# Patient Record
Sex: Male | Born: 1955
Health system: Southern US, Community
[De-identification: ages and names within clinical notes are randomized; demographics above are authoritative.]

## PROBLEM LIST (undated history)

## (undated) DIAGNOSIS — I10 Essential (primary) hypertension: Secondary | ICD-10-CM

## (undated) DIAGNOSIS — J45909 Unspecified asthma, uncomplicated: Secondary | ICD-10-CM

## (undated) DIAGNOSIS — Z87442 Personal history of urinary calculi: Secondary | ICD-10-CM

## (undated) DIAGNOSIS — N200 Calculus of kidney: Secondary | ICD-10-CM

## (undated) DIAGNOSIS — N400 Enlarged prostate without lower urinary tract symptoms: Secondary | ICD-10-CM

## (undated) HISTORY — DX: Unspecified asthma, uncomplicated: J45.909

## (undated) HISTORY — PX: COLONOSCOPY: SHX174

## (undated) HISTORY — DX: Essential (primary) hypertension: I10

## (undated) HISTORY — PX: CYSTOSCOPY WITH HOLMIUM LASER LITHOTRIPSY: SHX6639

---
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
## 2007-12-01 ENCOUNTER — Ambulatory Visit (HOSPITAL_COMMUNITY): Admission: RE | Admit: 2007-12-01 | Discharge: 2007-12-01 | Payer: Self-pay | Admitting: Urology

## 2007-12-08 ENCOUNTER — Ambulatory Visit (HOSPITAL_COMMUNITY): Admission: RE | Admit: 2007-12-08 | Discharge: 2007-12-08 | Payer: Self-pay | Admitting: Urology

## 2018-02-05 DIAGNOSIS — Z23 Encounter for immunization: Secondary | ICD-10-CM | POA: Diagnosis not present

## 2018-06-04 DIAGNOSIS — Z0000 Encounter for general adult medical examination without abnormal findings: Secondary | ICD-10-CM | POA: Diagnosis not present

## 2018-06-04 DIAGNOSIS — R5383 Other fatigue: Secondary | ICD-10-CM | POA: Diagnosis not present

## 2018-06-04 DIAGNOSIS — E7800 Pure hypercholesterolemia, unspecified: Secondary | ICD-10-CM | POA: Diagnosis not present

## 2018-06-04 DIAGNOSIS — J45909 Unspecified asthma, uncomplicated: Secondary | ICD-10-CM | POA: Diagnosis not present

## 2018-06-04 DIAGNOSIS — N401 Enlarged prostate with lower urinary tract symptoms: Secondary | ICD-10-CM | POA: Diagnosis not present

## 2018-06-04 DIAGNOSIS — Z1211 Encounter for screening for malignant neoplasm of colon: Secondary | ICD-10-CM | POA: Diagnosis not present

## 2018-08-20 DIAGNOSIS — J45909 Unspecified asthma, uncomplicated: Secondary | ICD-10-CM | POA: Diagnosis not present

## 2018-08-20 DIAGNOSIS — I10 Essential (primary) hypertension: Secondary | ICD-10-CM | POA: Diagnosis not present

## 2018-08-20 DIAGNOSIS — Z6827 Body mass index (BMI) 27.0-27.9, adult: Secondary | ICD-10-CM | POA: Diagnosis not present

## 2018-08-20 DIAGNOSIS — E7800 Pure hypercholesterolemia, unspecified: Secondary | ICD-10-CM | POA: Diagnosis not present

## 2018-08-20 DIAGNOSIS — Z299 Encounter for prophylactic measures, unspecified: Secondary | ICD-10-CM | POA: Diagnosis not present

## 2018-12-03 ENCOUNTER — Other Ambulatory Visit: Payer: Self-pay

## 2018-12-03 DIAGNOSIS — Z20822 Contact with and (suspected) exposure to covid-19: Secondary | ICD-10-CM

## 2018-12-05 LAB — NOVEL CORONAVIRUS, NAA: SARS-CoV-2, NAA: NOT DETECTED

## 2019-05-06 DIAGNOSIS — Z8616 Personal history of COVID-19: Secondary | ICD-10-CM

## 2019-05-06 HISTORY — DX: Personal history of COVID-19: Z86.16

## 2019-05-30 ENCOUNTER — Ambulatory Visit: Payer: 59 | Attending: Internal Medicine

## 2019-05-30 ENCOUNTER — Other Ambulatory Visit: Payer: Self-pay

## 2019-05-30 DIAGNOSIS — Z20822 Contact with and (suspected) exposure to covid-19: Secondary | ICD-10-CM

## 2019-05-31 LAB — NOVEL CORONAVIRUS, NAA: SARS-CoV-2, NAA: DETECTED — AB

## 2019-09-30 ENCOUNTER — Other Ambulatory Visit: Payer: Self-pay

## 2019-09-30 ENCOUNTER — Other Ambulatory Visit (HOSPITAL_COMMUNITY)
Admission: RE | Admit: 2019-09-30 | Discharge: 2019-09-30 | Disposition: A | Discharge status: Home / Self Care | Payer: 59 | Source: Ambulatory Visit | Attending: Urology | Admitting: Urology

## 2019-09-30 ENCOUNTER — Other Ambulatory Visit: Payer: Self-pay | Admitting: Urology

## 2019-09-30 ENCOUNTER — Telehealth: Payer: Self-pay

## 2019-09-30 ENCOUNTER — Ambulatory Visit: Payer: 59 | Admitting: Urology

## 2019-09-30 ENCOUNTER — Encounter: Payer: Self-pay | Admitting: Urology

## 2019-09-30 VITALS — BP 158/89 | HR 80 | Temp 97.3°F | Ht 69.0 in | Wt 185.0 lb

## 2019-09-30 DIAGNOSIS — R972 Elevated prostate specific antigen [PSA]: Secondary | ICD-10-CM

## 2019-09-30 DIAGNOSIS — R3912 Poor urinary stream: Secondary | ICD-10-CM | POA: Diagnosis not present

## 2019-09-30 DIAGNOSIS — N138 Other obstructive and reflux uropathy: Secondary | ICD-10-CM

## 2019-09-30 DIAGNOSIS — R3129 Other microscopic hematuria: Secondary | ICD-10-CM | POA: Diagnosis not present

## 2019-09-30 DIAGNOSIS — N403 Nodular prostate with lower urinary tract symptoms: Secondary | ICD-10-CM | POA: Diagnosis not present

## 2019-09-30 DIAGNOSIS — N401 Enlarged prostate with lower urinary tract symptoms: Secondary | ICD-10-CM | POA: Diagnosis not present

## 2019-09-30 LAB — POCT URINALYSIS DIPSTICK
Glucose, UA: NEGATIVE
Ketones, UA: NEGATIVE
Nitrite, UA: NEGATIVE
Protein, UA: POSITIVE — AB
Spec Grav, UA: 1.02 (ref 1.010–1.025)
Urobilinogen, UA: NEGATIVE E.U./dL — AB
pH, UA: 7 (ref 5.0–8.0)

## 2019-09-30 LAB — URINALYSIS, ROUTINE W REFLEX MICROSCOPIC
Bilirubin Urine: NEGATIVE
Glucose, UA: NEGATIVE mg/dL
Ketones, ur: NEGATIVE mg/dL
Nitrite: NEGATIVE
Protein, ur: 30 mg/dL — AB
RBC / HPF: >50 RBC/hpf — ABNORMAL HIGH (ref 0–5)
Specific Gravity, Urine: 1.019 (ref 1.005–1.030)
pH: 6 (ref 5.0–8.0)

## 2019-09-30 NOTE — Progress Notes (Signed)
Subjective: 1. Elevated PSA   2. BPH with urinary obstruction   3. Nodular prostate with lower urinary tract symptoms   4. Weak urinary stream   5. Microhematuria      Jerry Walker is a 64 yo AAM who is sent in consultation by Dr. Sherryll Burger for an elevated PSA that was 5.2 in 1/20.  He had a workplace physical in February and it was up to 14.  He saw Dr. Sherryll Burger and had another PSA ordered in March but didn't get that done.     He had a positive Covid test in 05/25/19 and had infusion therapy and was pretty sick with it but has recovered.  He has moderate LUTS with an IPSS of 14.  He had seen Dr. Nechama Guard in the past and was placed on tamsulosin several years ago but Dr. Sherryll Burger increased it to 2 daily.  He is voiding better with the higher dose.  He has not had a prior prostate biopsy.  He has had no hematuria or dysuria.   His UA is heme and LE positive today.    ROS:  ROS  Not on File  Past Medical History:  Diagnosis Date  . Asthma   . Hypertension   . Kidney stones     History reviewed. No pertinent surgical history.  Social History   Socioeconomic History  . Marital status: Single    Spouse name: Not on file  . Number of children: Not on file  . Years of education: Not on file  . Highest education level: Not on file  Occupational History  . Occupation: Chiropractor  Tobacco Use  . Smoking status: Never Smoker  . Smokeless tobacco: Never Used  Substance and Sexual Activity  . Alcohol use: Not on file  . Drug use: Not on file  . Sexual activity: Not on file  Other Topics Concern  . Not on file  Social History Narrative  . Not on file   Social Determinants of Health   Financial Resource Strain:   . Difficulty of Paying Living Expenses:   Food Insecurity:   . Worried About Programme researcher, broadcasting/film/video in the Last Year:   . Barista in the Last Year:   Transportation Needs:   . Freight forwarder (Medical):   Marland Kitchen Lack of Transportation (Non-Medical):   Physical  Activity:   . Days of Exercise per Week:   . Minutes of Exercise per Session:   Stress:   . Feeling of Stress :   Social Connections:   . Frequency of Communication with Friends and Family:   . Frequency of Social Gatherings with Friends and Family:   . Attends Religious Services:   . Active Member of Clubs or Organizations:   . Attends Banker Meetings:   Marland Kitchen Marital Status:   Intimate Partner Violence:   . Fear of Current or Ex-Partner:   . Emotionally Abused:   Marland Kitchen Physically Abused:   . Sexually Abused:     History reviewed. No pertinent family history.  Anti-infectives: Anti-infectives (From admission, onward)   None      Current Outpatient Medications  Medication Sig Dispense Refill  . FLOVENT HFA 110 MCG/ACT inhaler Inhale 2 puffs into the lungs 2 (two) times daily.    . hydrochlorothiazide (HYDRODIURIL) 25 MG tablet Take 25 mg by mouth daily.    . tamsulosin (FLOMAX) 0.4 MG CAPS capsule Take 0.8 mg by mouth daily.     No current  facility-administered medications for this visit.     Objective: Vital signs in last 24 hours: BP (!) 158/89   Pulse 80   Temp (!) 97.3 F (36.3 C)   Ht 5\' 9"  (1.753 m)   Wt 185 lb (83.9 kg)   BMI 27.32 kg/m   Intake/Output from previous day: No intake/output data recorded. Intake/Output this shift: @IOTHISSHIFT @   Physical Exam Vitals reviewed.  Constitutional:      Appearance: Normal appearance.  Genitourinary:    Comments: Nl Uncirc phallus with adequate meatus. Scrotum normal. Testes normal. Epididymis normal except right head spermatocele.  AP small hemorrhoid. NST without mass. Prostate 2.5+ benign. SV's non-palpable.  Lymphadenopathy:     Cervical: No cervical adenopathy.     Upper Body:     Right upper body: No supraclavicular adenopathy.     Left upper body: No supraclavicular adenopathy.  Neurological:     Mental Status: He is alert.     Lab Results:  Results for orders placed or  performed in visit on 09/30/19 (from the past 24 hour(s))  POCT urinalysis dipstick     Status: Abnormal   Collection Time: 09/30/19  9:42 AM  Result Value Ref Range   Color, UA dk yellow    Clarity, UA clear    Glucose, UA Negative Negative   Bilirubin, UA small    Ketones, UA neg    Spec Grav, UA 1.020 1.010 - 1.025   Blood, UA large    pH, UA 7.0 5.0 - 8.0   Protein, UA Positive (A) Negative   Urobilinogen, UA negative (A) 0.2 or 1.0 E.U./dL   Nitrite, UA neg    Leukocytes, UA Moderate (2+) (A) Negative   Appearance clear    Odor      BMET No results for input(s): NA, K, CL, CO2, GLUCOSE, BUN, CREATININE, CALCIUM in the last 72 hours. PT/INR No results for input(s): LABPROT, INR in the last 72 hours. ABG No results for input(s): PHART, HCO3 in the last 72 hours.  Invalid input(s): PCO2, PO2  Studies/Results: No results found.   Assessment/Plan: Elevated PSA.  His PSA was 5.2 in 1/20 but jumped to 14 in 2/21 but he had COVID in 1/21 which can increase the PSA.   I will repeat the PSA and if it remains elevated get him set up for a biopsy.  I reviewed the risks of bleeding,infection and voiding difficulty.  If the PSA comes down to normal, I will have him return in 6 months with a PSA.   I will also get records from Dr. Exie Parody, his prior urologist.  Microhematuria and pyuria on UA.  I will get a UA micro and culture.  BPH with BOO.  He will stay on tamsulosin.    No orders of the defined types were placed in this encounter.    Orders Placed This Encounter  Procedures  . Urine Culture    Standing Status:   Future    Standing Expiration Date:   09/29/2020  . Urinalysis, Routine w reflex microscopic    Standing Status:   Future    Standing Expiration Date:   09/29/2020  . PSA, total and free    Standing Status:   Future    Standing Expiration Date:   09/29/2020  . PSA, total and free    Standing Status:   Future    Standing Expiration Date:   09/29/2020  . POCT  urinalysis dipstick     Return in about 6  months (around 04/01/2020) for with PSA. Marland Kitchen    CC: Dr. Kirstie Peri.     Jerry Walker 09/30/2019 [phone]

## 2019-09-30 NOTE — Telephone Encounter (Signed)
-----   Message from Bjorn Pippin, MD sent at 09/29/2019  7:19 PM EDT ----- This fellow is referred from [facility].  I see PSA elevation on his list of Dx in Shah's note but I don't see the results.  I will need that.

## 2019-09-30 NOTE — Progress Notes (Signed)
Urological Symptom Review  Patient is experiencing the following symptoms: Frequent urination Hard to postpone urination Get up at night to urinate Trouble starting stream   Review of Systems  Gastrointestinal (upper)  : Negative for upper GI symptoms  Gastrointestinal (lower) : Negative for lower GI symptoms  Constitutional : Negative for symptoms  Skin: Negative for skin symptoms  Eyes: Negative for eye symptoms  Ear/Nose/Throat : Negative for Ear/Nose/Throat symptoms  Hematologic/Lymphatic: Negative for Hematologic/Lymphatic symptoms  Cardiovascular : Negative for cardiovascular symptoms  Respiratory : Negative for respiratory symptoms  Endocrine: Negative for endocrine symptoms  Musculoskeletal: Negative for musculoskeletal symptoms  Neurological: Negative for neurological symptoms  Psychologic: Negative for psychiatric symptoms 

## 2019-09-30 NOTE — Telephone Encounter (Signed)
Called Dr. Sherryll Burger office. Will send recent psa from 2020

## 2019-10-01 LAB — URINE CULTURE: Culture: NO GROWTH

## 2019-10-04 ENCOUNTER — Telehealth: Payer: Self-pay

## 2019-10-04 ENCOUNTER — Other Ambulatory Visit: Payer: Self-pay

## 2019-10-04 DIAGNOSIS — R3129 Other microscopic hematuria: Secondary | ICD-10-CM

## 2019-10-04 LAB — PSA, TOTAL AND FREE
PSA, % Free: 11 % (calc) — ABNORMAL LOW (ref >25)
PSA, Free: 1 ng/mL
PSA, Total: 8.9 ng/mL — ABNORMAL HIGH (ref <=4.0)

## 2019-10-04 NOTE — Telephone Encounter (Signed)
Ct ordered. Pt called and made aware.

## 2019-10-04 NOTE — Telephone Encounter (Signed)
-----   Message from Bjorn Pippin, MD sent at 10/04/2019 11:48 AM EDT ----- His culture is negative but he has significant micro hematuria with >50 RBC's.    He needs a CT hematuria study and will need a f/u appointment for cystoscopy.   ----- Message ----- From: Gustavus Messing, LPN Sent: 07/08/5215  10:23 AM EDT To: Bjorn Pippin, MD  Please review

## 2019-10-05 NOTE — Progress Notes (Signed)
His PSA is back up to 8.9 with a low f/t ratio of 11%.  He needs to be scheduled for a prostate [place] and biopsy and will need levaquin 750mg  sent for the prep.  We discussed the biopsy at his visit.

## 2019-10-06 ENCOUNTER — Other Ambulatory Visit: Payer: Self-pay

## 2019-10-06 ENCOUNTER — Other Ambulatory Visit: Payer: Self-pay | Admitting: Urology

## 2019-10-06 ENCOUNTER — Telehealth: Payer: Self-pay

## 2019-10-06 ENCOUNTER — Other Ambulatory Visit (HOSPITAL_COMMUNITY): Payer: Self-pay | Admitting: Urology

## 2019-10-06 DIAGNOSIS — R972 Elevated prostate specific antigen [PSA]: Secondary | ICD-10-CM

## 2019-10-06 MED ORDER — LEVOFLOXACIN 750 MG PO TABS: 750.0000 mg | ORAL_TABLET | ORAL | 0 refills | Status: AC

## 2019-10-07 NOTE — Telephone Encounter (Signed)
Pt. returned call to office this a.m. and given biopsy date, time, and instructions. Pt. voiced understanding.

## 2019-10-18 ENCOUNTER — Other Ambulatory Visit: Payer: Self-pay

## 2019-10-18 ENCOUNTER — Ambulatory Visit (HOSPITAL_COMMUNITY)
Admission: RE | Admit: 2019-10-18 | Discharge: 2019-10-18 | Disposition: A | Discharge status: Home / Self Care | Payer: 59 | Source: Ambulatory Visit | Attending: Urology | Admitting: Urology

## 2019-10-18 DIAGNOSIS — R3129 Other microscopic hematuria: Secondary | ICD-10-CM | POA: Diagnosis present

## 2019-10-18 LAB — POCT I-STAT CREATININE: Creatinine, Ser: 1.2 mg/dL (ref 0.61–1.24)

## 2019-10-18 MED ORDER — IOHEXOL 300 MG/ML  SOLN
150.0000 mL | Freq: Once | INTRAMUSCULAR | Status: AC | PRN
  Administered 2019-10-18: 125 mL via INTRAVENOUS

## 2019-10-20 NOTE — Progress Notes (Signed)
Pt decided he wanted sedation for the biopsy.   We will cancel today and reschedule in the OR.

## 2019-10-21 ENCOUNTER — Ambulatory Visit (INDEPENDENT_AMBULATORY_CARE_PROVIDER_SITE_OTHER): Payer: 59 | Admitting: Urology

## 2019-10-21 ENCOUNTER — Encounter (HOSPITAL_COMMUNITY): Payer: Self-pay

## 2019-10-21 ENCOUNTER — Other Ambulatory Visit: Payer: Self-pay

## 2019-10-21 ENCOUNTER — Ambulatory Visit (HOSPITAL_COMMUNITY)
Admission: RE | Admit: 2019-10-21 | Discharge: 2019-10-21 | Disposition: A | Discharge status: Home / Self Care | Payer: 59 | Source: Ambulatory Visit | Attending: Urology | Admitting: Urology

## 2019-10-21 DIAGNOSIS — R972 Elevated prostate specific antigen [PSA]: Secondary | ICD-10-CM

## 2019-10-21 MED ORDER — CEFTRIAXONE SODIUM 1 G IJ SOLR
INTRAMUSCULAR | Status: AC
  Filled 2019-10-21: qty 10

## 2019-10-21 MED ORDER — LIDOCAINE HCL (PF) 2 % IJ SOLN
INTRAMUSCULAR | Status: AC
  Filled 2019-10-21: qty 10

## 2019-10-21 MED ORDER — LIDOCAINE HCL (PF) 1 % IJ SOLN
INTRAMUSCULAR | Status: AC
  Filled 2019-10-21: qty 5

## 2019-10-27 NOTE — Progress Notes (Signed)
Subjective: 1. Jerry Walker, Jerry Walker   2. Jerry Walker   3. Jerry Walker     10/28/19: Jerry Walker returns today in f/u.  He declined the recent biopsy because he wanted sedation.  His Walker had gone up to 8.9 with 11% f/t ratio.  He had a CT for hematuria and was found to have a 55mm left Jerry pelvic stone.  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      Jerry Walker is a 64 yo AAM who is sent in consultation by Jerry Walker for an Jerry Walker that was 5.2 in 1/20.  He had a workplace physical in February and it was up to 14.  He saw Jerry Walker and had another Walker ordered in March but didn't get that done.     He had a positive Covid test in 05/25/19 and had infusion therapy and was pretty sick with it but has recovered.  He has moderate LUTS with an IPSS of 14.  He had seen Jerry Walker in the past and was placed on tamsulosin several years ago but Jerry Walker increased it to 2 daily.  He is voiding better with the higher dose.  He has not had a prior prostate biopsy.  He has had no hematuria or dysuria.   His UA is heme and LE positive today.       ROS:  Review of Systems  All other systems reviewed and are negative.   No Known Allergies  Past Medical History:  Diagnosis Date  . Asthma   . Hypertension   . Kidney Walker     History reviewed. No pertinent surgical history.  Social History   Socioeconomic History  . Marital status: Single    Spouse name: Not on file  . Number of children: Not on file  . Years of education: Not on file  . Highest education level: Not on file  Occupational History  . Occupation: Human resources officer  Tobacco Use  . Smoking status: Never Smoker  . Smokeless tobacco: Never Used  Substance and Sexual Activity  . Alcohol use: Not on file  . Drug use: Not on file  . Sexual activity: Not on file  Other  Topics Concern  . Not on file  Social History Narrative  . Not on file   Social Determinants of Health   Financial Resource Strain:   . Difficulty of Paying Living Expenses:   Food Insecurity:   . Worried About Charity fundraiser in the Last Year:   . Arboriculturist in the Last Year:   Transportation Needs:   . Film/video editor (Medical):   Marland Kitchen Lack of Transportation (Non-Medical):   Physical Activity:   . Days of Exercise per Week:   . Minutes of Exercise per Session:   Stress:   . Feeling of Stress :   Social Connections:   . Frequency of Communication with Friends and Family:   . Frequency of Social Gatherings with Friends and Family:   . Attends Religious Services:   . Active Member of Clubs or Organizations:   . Attends Archivist Meetings:   Marland Kitchen Marital Status:   Intimate Partner Violence:   . Fear of Current or Ex-Partner:   . Emotionally Abused:   Marland Kitchen Physically Abused:   . Sexually Abused:  History reviewed. No pertinent family history.  Anti-infectives: Anti-infectives (From admission, onward)   Start     Dose/Rate Route Frequency Ordered Stop   10/28/19 0915  CIPROFLOXACIN HCL 500 MG PO TABS  Status:  Discontinued        500 mg Oral  Once 10/28/19 3009 10/28/19 0936      Current Outpatient Medications  Medication Sig Dispense Refill  . FLOVENT HFA 110 MCG/ACT inhaler Inhale 2 puffs into the lungs 2 (two) times daily.    . hydrochlorothiazide (HYDRODIURIL) 25 MG tablet Take 25 mg by mouth daily.    . tamsulosin (FLOMAX) 0.4 MG CAPS capsule Take 0.8 mg by mouth daily.     No current facility-administered medications for this visit.     Objective: Vital signs in last 24 hours: BP 131/79   Pulse 66   Temp (!) 97.3 F (36.3 C)   Ht 5\' 9"  (1.753 m)   Wt 185 lb (83.9 kg)   BMI 27.32 kg/m   Intake/Output from previous day: No intake/output data recorded. Intake/Output this shift: @IOTHISSHIFT @   Physical Exam Vitals reviewed.   Constitutional:      Appearance: Normal appearance.  Cardiovascular:     Rate and Rhythm: Normal rate and regular rhythm.  Pulmonary:     Effort: Pulmonary effort is normal.     Breath sounds: Normal breath sounds.  Neurological:     Mental Status: He is alert.     Lab Results:  Results for orders placed or performed in visit on 10/28/19 (from the past 24 hour(s))  POCT urinalysis dipstick     Status: Abnormal   Collection Time: 10/28/19  9:13 AM  Result Value Ref Range   Color, UA yellow    Clarity, UA     Glucose, UA Negative Negative   Bilirubin, UA neg    Ketones, UA neg    Spec Grav, UA 1.020 1.010 - 1.025   Blood, UA +++    pH, UA 5.0 5.0 - 8.0   Protein, UA Positive (A) Negative   Urobilinogen, UA 0.2 0.2 or 1.0 E.U./dL   Nitrite, UA neg    Leukocytes, UA Small (1+) (A) Negative   Appearance clear    Odor      Walker 8.9 with 11% f/t ratio.   Studies/Results: CT IMPRESSION: 1. Moderately large calculus in the LEFT Jerry pelvis just proximal to the UPJ and associated with mild hydronephrosis and peripelvic stranding. Enhancement remain symmetric and contrast is Walker beyond the calculus. 2. Numerous calculi in the RIGHT kidney greater than 10, largest in the lower pole measuring approximately 1 cm with at least 4 calculi greater than 5 mm. 3. Numerous venous collaterals in the pelvis exaggerate the appearance of the rectal and and the urinary bladder wall. Significance and etiology is uncertain as there is no clear explanation for the findings seen on the current exam. Note that the prostate and bladder base are heterogeneous as described. Cystoscopic assessment of the urinary bladder may be helpful in addition to assessment of the prostate gland which has a nonspecific appearance on CT but is indistinct on today's study. 4. Enhancing lesion arising from the lateral segment of the LEFT hepatic lobe likely a hemangioma. Consider follow-up MRI for  further assessment. 5. Rectal thickening favored to represent internal hemorrhoids given the numerous venous collaterals in the pelvis. Correlation with recent colonoscopy results and or patient history may be helpful. 6. Mild congestive changes are suggested in the abdominal mesentery of  the small bowel without focal lesion. No nodularity in the peritoneal cavity elsewhere or other suspicious finding such as ascites. Correlate with any abdominal symptoms, similar changes though less pronounced were Walker in August 30, 2007.   Assessment/Plan: Jerry Walker.  His Walker on repeat is up to 8.9 with 11% f/t ration and the prostate is abnormal in appearance on CT.  He declined a non-sedated biopsy and will be rescheduled for the OR.   I reviewed the risks of bleeding, infection and voiding difficulty.  Microhematuria.  He has a 1cm left Jerry pelvic stone with mild obstruction but no pain.  He has parenchymal calcifications on the right with some Jerry atrophy and had prior ESWL on that side.   Since he needs to go to the OR for the prostate [place] and biopsy, I will get him set up for ureteroscopy with laser and stent and reviewed the risks of bleeding, infection, ureteral injury, need for a stent and secondary procedures, thrombotic events and anesthetic complications.    BPH with BOO.  He will stay on tamsulosin.    Meds ordered this encounter  Medications  . DISCONTD: ciprofloxacin (CIPRO) tablet 500 mg     Orders Placed This Encounter  Procedures  . POCT urinalysis dipstick     Return for Next available for prostate biopsy and left ureteroscopy in the OR.  .    CC: Jerry Walker.     Jerry Walker 10/28/2019 [phone]

## 2019-10-27 NOTE — H&P (View-Only) (Signed)
Subjective: 1. Jerry Walker, unspecified whether lower urinary tract symptoms present   2. Elevated PSA   3. Renal stones     10/28/19: Jerry Walker returns today in f/u.  He declined the recent biopsy because he wanted sedation.  His PSA had gone up to 8.9 with 11% f/t ratio.  He had a CT for hematuria and was found to have a 55mm left renal pelvic stone.  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      Jerry Walker is a 64 yo AAM who is sent in consultation by Dr. Manuella Ghazi for an elevated PSA that was 5.2 in 1/20.  He had a workplace physical in February and it was up to 14.  He saw Dr. Manuella Ghazi and had another PSA ordered in March but didn't get that done.     He had a positive Covid test in 05/25/19 and had infusion therapy and was pretty sick with it but has recovered.  He has moderate LUTS with an IPSS of 14.  He had seen Dr. Exie Parody in the past and was placed on tamsulosin several years ago but Dr. Manuella Ghazi increased it to 2 daily.  He is voiding better with the higher dose.  He has not had a prior prostate biopsy.  He has had no hematuria or dysuria.   His UA is heme and LE positive today.       ROS:  Review of Systems  All other systems reviewed and are negative.   No Known Allergies  Past Medical History:  Diagnosis Date  . Asthma   . Hypertension   . Kidney stones     History reviewed. No pertinent surgical history.  Social History   Socioeconomic History  . Marital status: Single    Spouse name: Not on file  . Number of children: Not on file  . Years of education: Not on file  . Highest education level: Not on file  Occupational History  . Occupation: Human resources officer  Tobacco Use  . Smoking status: Never Smoker  . Smokeless tobacco: Never Used  Substance and Sexual Activity  . Alcohol use: Not on file  . Drug use: Not on file  . Sexual activity: Not on file  Other  Topics Concern  . Not on file  Social History Narrative  . Not on file   Social Determinants of Health   Financial Resource Strain:   . Difficulty of Paying Living Expenses:   Food Insecurity:   . Worried About Charity fundraiser in the Last Year:   . Arboriculturist in the Last Year:   Transportation Needs:   . Film/video editor (Medical):   Marland Kitchen Lack of Transportation (Non-Medical):   Physical Activity:   . Days of Exercise per Week:   . Minutes of Exercise per Session:   Stress:   . Feeling of Stress :   Social Connections:   . Frequency of Communication with Friends and Family:   . Frequency of Social Gatherings with Friends and Family:   . Attends Religious Services:   . Active Member of Clubs or Organizations:   . Attends Archivist Meetings:   Marland Kitchen Marital Status:   Intimate Partner Violence:   . Fear of Current or Ex-Partner:   . Emotionally Abused:   Marland Kitchen Physically Abused:   . Sexually Abused:  History reviewed. No pertinent family history.  Anti-infectives: Anti-infectives (From admission, onward)   Start     Dose/Rate Route Frequency Ordered Stop   10/28/19 0915  CIPROFLOXACIN HCL 500 MG PO TABS  Status:  Discontinued        500 mg Oral  Once 10/28/19 3009 10/28/19 0936      Current Outpatient Medications  Medication Sig Dispense Refill  . FLOVENT HFA 110 MCG/ACT inhaler Inhale 2 puffs into the lungs 2 (two) times daily.    . hydrochlorothiazide (HYDRODIURIL) 25 MG tablet Take 25 mg by mouth daily.    . tamsulosin (FLOMAX) 0.4 MG CAPS capsule Take 0.8 mg by mouth daily.     No current facility-administered medications for this visit.     Objective: Vital signs in last 24 hours: BP 131/79   Pulse 66   Temp (!) 97.3 F (36.3 C)   Ht 5\' 9"  (1.753 m)   Wt 185 lb (83.9 kg)   BMI 27.32 kg/m   Intake/Output from previous day: No intake/output data recorded. Intake/Output this shift: @IOTHISSHIFT @   Physical Exam Vitals reviewed.   Constitutional:      Appearance: Normal appearance.  Cardiovascular:     Rate and Rhythm: Normal rate and regular rhythm.  Pulmonary:     Effort: Pulmonary effort is normal.     Breath sounds: Normal breath sounds.  Neurological:     Mental Status: He is alert.     Lab Results:  Results for orders placed or performed in visit on 10/28/19 (from the past 24 hour(s))  POCT urinalysis dipstick     Status: Abnormal   Collection Time: 10/28/19  9:13 AM  Result Value Ref Range   Color, UA yellow    Clarity, UA     Glucose, UA Negative Negative   Bilirubin, UA neg    Ketones, UA neg    Spec Grav, UA 1.020 1.010 - 1.025   Blood, UA +++    pH, UA 5.0 5.0 - 8.0   Protein, UA Positive (A) Negative   Urobilinogen, UA 0.2 0.2 or 1.0 E.U./dL   Nitrite, UA neg    Leukocytes, UA Small (1+) (A) Negative   Appearance clear    Odor      PSA 8.9 with 11% f/t ratio.   Studies/Results: CT IMPRESSION: 1. Moderately large calculus in the LEFT renal pelvis just proximal to the UPJ and associated with mild hydronephrosis and peripelvic stranding. Enhancement remain symmetric and contrast is present beyond the calculus. 2. Numerous calculi in the RIGHT kidney greater than 10, largest in the lower pole measuring approximately 1 cm with at least 4 calculi greater than 5 mm. 3. Numerous venous collaterals in the pelvis exaggerate the appearance of the rectal and and the urinary bladder wall. Significance and etiology is uncertain as there is no clear explanation for the findings seen on the current exam. Note that the prostate and bladder base are heterogeneous as described. Cystoscopic assessment of the urinary bladder may be helpful in addition to assessment of the prostate gland which has a nonspecific appearance on CT but is indistinct on today's study. 4. Enhancing lesion arising from the lateral segment of the LEFT hepatic lobe likely a hemangioma. Consider follow-up MRI for  further assessment. 5. Rectal thickening favored to represent internal hemorrhoids given the numerous venous collaterals in the pelvis. Correlation with recent colonoscopy results and or patient history may be helpful. 6. Mild congestive changes are suggested in the abdominal mesentery of  the small bowel without focal lesion. No nodularity in the peritoneal cavity elsewhere or other suspicious finding such as ascites. Correlate with any abdominal symptoms, similar changes though less pronounced were present in 2009.   Assessment/Plan: Elevated PSA.  His PSA on repeat is up to 8.9 with 11% f/t ration and the prostate is abnormal in appearance on CT.  He declined a non-sedated biopsy and will be rescheduled for the OR.   I reviewed the risks of bleeding, infection and voiding difficulty.  Microhematuria.  He has a 1cm left renal pelvic stone with mild obstruction but no pain.  He has parenchymal calcifications on the right with some renal atrophy and had prior ESWL on that side.   Since he needs to go to the OR for the prostate US and biopsy, I will get him set up for ureteroscopy with laser and stent and reviewed the risks of bleeding, infection, ureteral injury, need for a stent and secondary procedures, thrombotic events and anesthetic complications.    BPH with BOO.  He will stay on tamsulosin.    Meds ordered this encounter  Medications  . DISCONTD: ciprofloxacin (CIPRO) tablet 500 mg     Orders Placed This Encounter  Procedures  . POCT urinalysis dipstick     Return for Next available for prostate biopsy and left ureteroscopy in the OR.  .    CC: Dr. Ashish Shah.     Bulmaro Feagans 10/28/2019 [phone] 

## 2019-10-28 ENCOUNTER — Encounter: Payer: Self-pay | Admitting: Urology

## 2019-10-28 ENCOUNTER — Other Ambulatory Visit: Payer: Self-pay

## 2019-10-28 ENCOUNTER — Ambulatory Visit (INDEPENDENT_AMBULATORY_CARE_PROVIDER_SITE_OTHER): Payer: 59 | Admitting: Urology

## 2019-10-28 VITALS — BP 131/79 | HR 66 | Temp 97.3°F | Ht 69.0 in | Wt 185.0 lb

## 2019-10-28 DIAGNOSIS — N200 Calculus of kidney: Secondary | ICD-10-CM

## 2019-10-28 DIAGNOSIS — R972 Elevated prostate specific antigen [PSA]: Secondary | ICD-10-CM

## 2019-10-28 DIAGNOSIS — N400 Enlarged prostate without lower urinary tract symptoms: Secondary | ICD-10-CM

## 2019-10-28 LAB — POCT URINALYSIS DIPSTICK
Bilirubin, UA: NEGATIVE
Glucose, UA: NEGATIVE
Ketones, UA: NEGATIVE
Nitrite, UA: NEGATIVE
Protein, UA: POSITIVE — AB
Spec Grav, UA: 1.02 (ref 1.010–1.025)
Urobilinogen, UA: 0.2 E.U./dL
pH, UA: 5 (ref 5.0–8.0)

## 2019-10-28 MED ORDER — CIPROFLOXACIN HCL 500 MG PO TABS: 500.0000 mg | ORAL_TABLET | Freq: Once | ORAL | Status: DC

## 2019-10-28 NOTE — Progress Notes (Signed)
Urological Symptom Review  Patient is experiencing the following symptoms: Frequent urination Hard to postpone urination Get up at night to urinate   Review of Systems  Gastrointestinal (upper)  : Negative for upper GI symptoms  Gastrointestinal (lower) : Negative for lower GI symptoms  Constitutional : Negative for symptoms  Skin: Negative for skin symptoms  Eyes: Negative for eye symptoms  Ear/Nose/Throat : Negative for Ear/Nose/Throat symptoms  Hematologic/Lymphatic: Negative for Hematologic/Lymphatic symptoms  Cardiovascular : Negative for cardiovascular symptoms  Respiratory : Negative for respiratory symptoms  Endocrine: Negative for endocrine symptoms  Musculoskeletal: Negative for musculoskeletal symptoms  Neurological: Negative for neurological symptoms  Psychologic: Negative for psychiatric symptoms  

## 2019-11-01 ENCOUNTER — Other Ambulatory Visit: Payer: Self-pay | Admitting: Urology

## 2019-11-10 ENCOUNTER — Other Ambulatory Visit: Payer: Self-pay

## 2019-11-10 ENCOUNTER — Encounter (HOSPITAL_BASED_OUTPATIENT_CLINIC_OR_DEPARTMENT_OTHER): Payer: Self-pay | Admitting: Urology

## 2019-11-10 NOTE — Progress Notes (Addendum)
NEW Covid Policy July 2021  Surgery Day:  11/17/2019  Facility:  [facility]  Type of Surgery: BIOPSY TRANSRECTAL ULTRASONIC PROSTATE (TUBP) URETEROSCOPY STONE EXTRACTION WITH HOLMIUM LASER LITHOTRIPSY AND STENT  Fully Covid Vaccinated:   1) 08/17/2019                                          2) 09/07/2019                                          Where?                                           Type? PFIZER  Do you have symptoms? No  In the past 14 days:        Have you had any symptoms? No       Have you been tested covid positive? No       Have you been in contact with someone covid positive? No        Is pt Immuno-compromised? No

## 2019-11-10 NOTE — Progress Notes (Signed)
Spoke with: Jerry Walker NPO:  No food after midnight/Clear liquids until 745 AM DOS Arrival time: 0845 AM Lab needs dos----     istat 8, EKG         Lab results------N./A COVID test ------No fully vaccinated on 09/07/2019 , patient instructed to bring card Medications to take morning of surgery:Tamsulosin Pre op orders in epic Yes Diabetic medication -----N/A Patient Special Instructions -----N/A Pre-Op special Istructions -----N/A Ride home: Jerry Walker (sister)  Patient verbalized understanding of instructions that were given at this phone interview. Patient denies shortness of breath, chest pain, fever, cough a this phone interview.

## 2019-11-16 MED ORDER — GENTAMICIN SULFATE 40 MG/ML IJ SOLN
5.0000 mg/kg | INTRAVENOUS | Status: AC
  Administered 2019-11-17: 420 mg via INTRAVENOUS
  Filled 2019-11-16: qty 10.5

## 2019-11-17 ENCOUNTER — Encounter (HOSPITAL_BASED_OUTPATIENT_CLINIC_OR_DEPARTMENT_OTHER)
Admission: RE | Disposition: A | Discharge status: Home / Self Care | Payer: Self-pay | Source: Home / Self Care | Attending: Urology

## 2019-11-17 ENCOUNTER — Ambulatory Visit (HOSPITAL_BASED_OUTPATIENT_CLINIC_OR_DEPARTMENT_OTHER): Payer: 59 | Admitting: Anesthesiology

## 2019-11-17 ENCOUNTER — Ambulatory Visit (HOSPITAL_BASED_OUTPATIENT_CLINIC_OR_DEPARTMENT_OTHER)
Admission: RE | Admit: 2019-11-17 | Discharge: 2019-11-17 | Disposition: A | Discharge status: Home / Self Care | Payer: 59 | Attending: Urology | Admitting: Urology

## 2019-11-17 ENCOUNTER — Other Ambulatory Visit: Payer: Self-pay

## 2019-11-17 ENCOUNTER — Ambulatory Visit (HOSPITAL_COMMUNITY)
Admission: RE | Admit: 2019-11-17 | Discharge: 2019-11-17 | Disposition: A | Discharge status: Home / Self Care | Payer: 59 | Source: Ambulatory Visit | Attending: Urology | Admitting: Urology

## 2019-11-17 ENCOUNTER — Encounter (HOSPITAL_BASED_OUTPATIENT_CLINIC_OR_DEPARTMENT_OTHER): Payer: Self-pay | Admitting: Urology

## 2019-11-17 DIAGNOSIS — R972 Elevated prostate specific antigen [PSA]: Secondary | ICD-10-CM

## 2019-11-17 DIAGNOSIS — J45909 Unspecified asthma, uncomplicated: Secondary | ICD-10-CM | POA: Diagnosis not present

## 2019-11-17 DIAGNOSIS — N320 Bladder-neck obstruction: Secondary | ICD-10-CM | POA: Diagnosis not present

## 2019-11-17 DIAGNOSIS — Z7951 Long term (current) use of inhaled steroids: Secondary | ICD-10-CM | POA: Diagnosis not present

## 2019-11-17 DIAGNOSIS — N200 Calculus of kidney: Secondary | ICD-10-CM | POA: Diagnosis present

## 2019-11-17 DIAGNOSIS — N401 Enlarged prostate with lower urinary tract symptoms: Secondary | ICD-10-CM | POA: Diagnosis not present

## 2019-11-17 DIAGNOSIS — N400 Enlarged prostate without lower urinary tract symptoms: Secondary | ICD-10-CM | POA: Diagnosis present

## 2019-11-17 DIAGNOSIS — Z87442 Personal history of urinary calculi: Secondary | ICD-10-CM | POA: Insufficient documentation

## 2019-11-17 DIAGNOSIS — Z79899 Other long term (current) drug therapy: Secondary | ICD-10-CM | POA: Insufficient documentation

## 2019-11-17 DIAGNOSIS — I10 Essential (primary) hypertension: Secondary | ICD-10-CM | POA: Diagnosis not present

## 2019-11-17 DIAGNOSIS — Z8616 Personal history of COVID-19: Secondary | ICD-10-CM | POA: Diagnosis not present

## 2019-11-17 HISTORY — DX: Benign prostatic hyperplasia without lower urinary tract symptoms: N40.0

## 2019-11-17 HISTORY — PX: PROSTATE BIOPSY: SHX241

## 2019-11-17 HISTORY — DX: Personal history of urinary calculi: Z87.442

## 2019-11-17 HISTORY — PX: URETEROSCOPY WITH HOLMIUM LASER LITHOTRIPSY: SHX6645

## 2019-11-17 LAB — POCT I-STAT, CHEM 8
BUN: 12 mg/dL (ref 8–23)
Calcium, Ion: 1.07 mmol/L — ABNORMAL LOW (ref 1.15–1.40)
Chloride: 101 mmol/L (ref 98–111)
Creatinine, Ser: 1.1 mg/dL (ref 0.61–1.24)
Glucose, Bld: 107 mg/dL — ABNORMAL HIGH (ref 70–99)
HCT: 40 % (ref 39.0–52.0)
Hemoglobin: 13.6 g/dL (ref 13.0–17.0)
Potassium: 3.3 mmol/L — ABNORMAL LOW (ref 3.5–5.1)
Sodium: 143 mmol/L (ref 135–145)
TCO2: 31 mmol/L (ref 22–32)

## 2019-11-17 SURGERY — BIOPSY, PROSTATE, RECTAL APPROACH, WITH US GUIDANCE
Anesthesia: General | Site: Ureter

## 2019-11-17 MED ORDER — LIDOCAINE 2% (20 MG/ML) 5 ML SYRINGE
INTRAMUSCULAR | Status: AC
  Filled 2019-11-17: qty 5

## 2019-11-17 MED ORDER — PROPOFOL 10 MG/ML IV BOLUS
INTRAVENOUS | Status: AC
  Filled 2019-11-17: qty 20

## 2019-11-17 MED ORDER — ACETAMINOPHEN 10 MG/ML IV SOLN
INTRAVENOUS | Status: DC | PRN
  Administered 2019-11-17: 1000 mg via INTRAVENOUS

## 2019-11-17 MED ORDER — SODIUM CHLORIDE 0.9 % IR SOLN
Status: DC | PRN
  Administered 2019-11-17: 3000 mL

## 2019-11-17 MED ORDER — LIDOCAINE 2% (20 MG/ML) 5 ML SYRINGE
INTRAMUSCULAR | Status: DC | PRN
  Administered 2019-11-17: 80 mg via INTRAVENOUS

## 2019-11-17 MED ORDER — MIDAZOLAM HCL 2 MG/2ML IJ SOLN
INTRAMUSCULAR | Status: AC
  Filled 2019-11-17: qty 2

## 2019-11-17 MED ORDER — MIDAZOLAM HCL 5 MG/5ML IJ SOLN
INTRAMUSCULAR | Status: DC | PRN
  Administered 2019-11-17: 1 mg via INTRAVENOUS

## 2019-11-17 MED ORDER — SODIUM CHLORIDE 0.9 % IV SOLN: INTRAVENOUS | Status: DC

## 2019-11-17 MED ORDER — ONDANSETRON HCL 4 MG/2ML IJ SOLN
INTRAMUSCULAR | Status: DC | PRN
  Administered 2019-11-17: 4 mg via INTRAVENOUS

## 2019-11-17 MED ORDER — SODIUM CHLORIDE 0.9% FLUSH: 3.0000 mL | INTRAVENOUS | Status: DC | PRN

## 2019-11-17 MED ORDER — PROPOFOL 10 MG/ML IV BOLUS
INTRAVENOUS | Status: DC | PRN
  Administered 2019-11-17: 150 mg via INTRAVENOUS

## 2019-11-17 MED ORDER — PHENAZOPYRIDINE HCL 200 MG PO TABS
200.0000 mg | ORAL_TABLET | Freq: Three times a day (TID) | ORAL | 0 refills | Status: DC | PRN
Start: 2019-11-17 — End: 2019-12-02

## 2019-11-17 MED ORDER — GLYCOPYRROLATE PF 0.2 MG/ML IJ SOSY
PREFILLED_SYRINGE | INTRAMUSCULAR | Status: AC
  Filled 2019-11-17: qty 1

## 2019-11-17 MED ORDER — LACTATED RINGERS IV SOLN: INTRAVENOUS | Status: DC

## 2019-11-17 MED ORDER — IOHEXOL 300 MG/ML  SOLN
INTRAMUSCULAR | Status: DC | PRN
  Administered 2019-11-17: 6 mL via URETHRAL

## 2019-11-17 MED ORDER — CEFTRIAXONE SODIUM 1 G IJ SOLR
INTRAMUSCULAR | Status: AC
  Filled 2019-11-17: qty 10

## 2019-11-17 MED ORDER — SODIUM CHLORIDE 0.9% FLUSH: 3.0000 mL | Freq: Two times a day (BID) | INTRAVENOUS | Status: DC

## 2019-11-17 MED ORDER — ACETAMINOPHEN 325 MG PO TABS: 650.0000 mg | ORAL_TABLET | ORAL | Status: DC | PRN

## 2019-11-17 MED ORDER — DEXAMETHASONE SODIUM PHOSPHATE 10 MG/ML IJ SOLN
INTRAMUSCULAR | Status: DC | PRN
Start: 2019-11-17 — End: 2019-11-17
  Administered 2019-11-17: 10 mg via INTRAVENOUS

## 2019-11-17 MED ORDER — OXYCODONE HCL 5 MG PO TABS: 5.0000 mg | ORAL_TABLET | ORAL | Status: DC | PRN

## 2019-11-17 MED ORDER — SODIUM CHLORIDE 0.9 % IV SOLN: 250.0000 mL | INTRAVENOUS | Status: DC | PRN

## 2019-11-17 MED ORDER — SODIUM CHLORIDE 0.9 % IV SOLN
INTRAVENOUS | Status: AC
  Filled 2019-11-17: qty 100

## 2019-11-17 MED ORDER — ONDANSETRON HCL 4 MG/2ML IJ SOLN
INTRAMUSCULAR | Status: AC
  Filled 2019-11-17: qty 2

## 2019-11-17 MED ORDER — SODIUM CHLORIDE 0.9 % IV SOLN
1.0000 g | INTRAVENOUS | Status: AC
  Administered 2019-11-17: 1 g via INTRAVENOUS

## 2019-11-17 MED ORDER — FENTANYL CITRATE (PF) 100 MCG/2ML IJ SOLN
INTRAMUSCULAR | Status: AC
  Filled 2019-11-17: qty 2

## 2019-11-17 MED ORDER — ACETAMINOPHEN 10 MG/ML IV SOLN
INTRAVENOUS | Status: AC
  Filled 2019-11-17: qty 100

## 2019-11-17 MED ORDER — FENTANYL CITRATE (PF) 250 MCG/5ML IJ SOLN
INTRAMUSCULAR | Status: DC | PRN
  Administered 2019-11-17 (×2): 25 ug via INTRAVENOUS
  Administered 2019-11-17: 50 ug via INTRAVENOUS

## 2019-11-17 MED ORDER — HYDROCODONE-ACETAMINOPHEN 5-325 MG PO TABS: 1.0000 | ORAL_TABLET | ORAL | 0 refills | Status: DC | PRN

## 2019-11-17 MED ORDER — ACETAMINOPHEN 325 MG RE SUPP: 650.0000 mg | RECTAL | Status: DC | PRN

## 2019-11-17 SURGICAL SUPPLY — 31 items
BAG DRAIN URO-CYSTO SKYTR STRL (DRAIN) ×3 IMPLANT
BASKET STONE 1.7 NGAGE (UROLOGICAL SUPPLIES) ×3 IMPLANT
CATH URET 5FR 28IN CONE TIP (BALLOONS)
CATH URET 5FR 28IN OPEN ENDED (CATHETERS) ×3 IMPLANT
CATH URET 5FR 70CM CONE TIP (BALLOONS) IMPLANT
CLOTH BEACON ORANGE TIMEOUT ST (SAFETY) ×3 IMPLANT
ELECT REM PT RETURN 9FT ADLT (ELECTROSURGICAL)
ELECTRODE REM PT RTRN 9FT ADLT (ELECTROSURGICAL) IMPLANT
FIBER LASER TRAC TIP (UROLOGICAL SUPPLIES) ×3 IMPLANT
GLOVE BIOGEL PI IND STRL 7.5 (GLOVE) ×2 IMPLANT
GLOVE BIOGEL PI INDICATOR 7.5 (GLOVE) ×1
GLOVE SURG SS PI 8.0 STRL IVOR (GLOVE) ×3 IMPLANT
GOWN STRL REUS W/TWL LRG LVL4 (GOWN DISPOSABLE) ×3 IMPLANT
GOWN STRL REUS W/TWL XL LVL3 (GOWN DISPOSABLE) ×3 IMPLANT
GUIDEWIRE ANG ZIPWIRE 038X150 (WIRE) ×3 IMPLANT
GUIDEWIRE STR DUAL SENSOR (WIRE) ×3 IMPLANT
INST BIOPSY MAXCORE 18GX25 (NEEDLE) ×3 IMPLANT
IV NS IRRIG 3000ML ARTHROMATIC (IV SOLUTION) ×3 IMPLANT
KIT TURNOVER CYSTO (KITS) ×3 IMPLANT
MANIFOLD NEPTUNE II (INSTRUMENTS) ×3 IMPLANT
NDL SAFETY ECLIPSE 18X1.5 (NEEDLE) ×2 IMPLANT
NEEDLE HYPO 18GX1.5 SHARP (NEEDLE) ×1
NEEDLE SPNL 22GX7 QUINCKE BK (NEEDLE) IMPLANT
NS IRRIG 500ML POUR BTL (IV SOLUTION) ×3 IMPLANT
PACK CYSTO (CUSTOM PROCEDURE TRAY) ×3 IMPLANT
STENT URET 6FRX26 CONTOUR (STENTS) ×3 IMPLANT
SURGILUBE 2OZ TUBE FLIPTOP (MISCELLANEOUS) IMPLANT
SYR CONTROL 10ML LL (SYRINGE) IMPLANT
TOWEL OR 17X26 10 PK STRL BLUE (TOWEL DISPOSABLE) ×3 IMPLANT
TUBE CONNECTING 12X1/4 (SUCTIONS) ×3 IMPLANT
TUBING UROLOGY SET (TUBING) ×3 IMPLANT

## 2019-11-17 NOTE — Anesthesia Preprocedure Evaluation (Addendum)
Anesthesia Evaluation  Patient identified by MRN, date of birth, ID band Patient awake    Reviewed: Allergy & Precautions, NPO status , Patient's Chart, lab work & pertinent test results  Airway Mallampati: II  TM Distance: >3 FB Neck ROM: Full    Dental  (+) Dental Advisory Given, Teeth Intact   Pulmonary asthma ,    Pulmonary exam normal breath sounds clear to auscultation       Cardiovascular hypertension, Pt. on medications Normal cardiovascular exam Rhythm:Regular Rate:Normal     Neuro/Psych negative neurological ROS  negative psych ROS   GI/Hepatic negative GI ROS, Neg liver ROS,   Endo/Other  negative endocrine ROS  Renal/GU negative Renal ROS     Musculoskeletal negative musculoskeletal ROS (+)   Abdominal   Peds  Hematology negative hematology ROS (+)   Anesthesia Other Findings   Reproductive/Obstetrics                            Anesthesia Physical Anesthesia Plan  ASA: II  Anesthesia Plan: General   Post-op Pain Management:    Induction: Intravenous  PONV Risk Score and Plan: 3 and Ondansetron, Dexamethasone, Midazolam and Treatment may vary due to age or medical condition  Airway Management Planned: LMA  Additional Equipment: None  Intra-op Plan:   Post-operative Plan: Extubation in OR  Informed Consent: I have reviewed the patients History and Physical, chart, labs and discussed the procedure including the risks, benefits and alternatives for the proposed anesthesia with the patient or authorized representative who has indicated his/her understanding and acceptance.     Dental advisory given  Plan Discussed with: CRNA  Anesthesia Plan Comments:        Anesthesia Quick Evaluation

## 2019-11-17 NOTE — Transfer of Care (Signed)
Immediate Anesthesia Transfer of Care Note  Patient: Jerry Walker  Procedure(s) Performed: BIOPSY TRANSRECTAL ULTRASONIC PROSTATE (TUBP) (N/A Prostate) URETEROSCOPY STONE EXTRACTION, RETROGRADE WITH HOLMIUM LASER LITHOTRIPSY AND STENT (Left Ureter)  Patient Location: PACU  Anesthesia Type:General  Level of Consciousness: awake and patient cooperative  Airway & Oxygen Therapy: Patient Spontanous Breathing  Post-op Assessment: Report given to RN and Post -op Vital signs reviewed and stable  Post vital signs: Reviewed and stable  Last Vitals:  Vitals Value Taken Time  BP    Temp    Pulse 82 11/17/19 1240  Resp 12 11/17/19 1240  SpO2 100 % 11/17/19 1240  Vitals shown include unvalidated device data.  Last Pain:  Vitals:   11/17/19 0907  TempSrc: Oral  PainSc: 0-No pain      Patients Stated Pain Goal: 4 (11/17/19 7530)  Complications: No complications documented.

## 2019-11-17 NOTE — Op Note (Signed)
Procedure: 1.  Transrectal ultrasound-guided needle biopsy of the prostate. 2.  Cystoscopy with left retrograde pyelogram and interpretation. 3.  Left ureteroscopy with holmium laser lithotripsy, stone removal and insertion of left double-J stent. 4.  Application of fluoroscopy.  Preop diagnosis: 1.  Elevated PSA. 2.  10 mm left renal pelvic stone.  Postop diagnosis: Same.  Surgeon: Dr. Bjorn Pippin.  Anesthesia: General.  Specimen: 1.  12 core needle prostate biopsies. 2.  Stone fragments.  Drain: 6 [place] by 26 cm left contour double-J stent with tether.  EBL: 2 mL.  Complications: None.  Indications: The patient is a 64 year old male who has a history of urolithiasis and was found on recent evaluation to have bilateral renal stones with a 1 cm left renal pelvic stone with some mild obstruction and pelvic inflammatory changes and was felt to require treatment with ureteroscopy being selected.  He has right renal stones with some right renal atrophy but none of these were in position to obstruct.  He also has an elevated PSAOf 8.9 with 11% free to total ratio.  It had been up to 14 but he had Covid earlier this year.  He was felt to require prostate ultrasound and biopsy.  Procedure: He was taken operating room where he was given Rocephin and gentamicin.  A general anesthetic was induced.  He was placed in lithotomy position and fitted with PAS hose.  The transrectal ultrasound probe was inserted and diagnostic scanning was performed.  He had normal appearing seminal vesicles.  The prostate was somewhat asymmetric with the right transitional zone being more prominent than the left but no hypoechoic lesions were noted he did have a middle lobe that was fairly distinct from the remaining prostate.  The prostate volume was 46 mL without the middle lobe and 52 mL with the middle lobe.  He did have some prominent veins around the prostate as had been described on CT.  After the initial scan he  underwent 12 needle biopsies in the standard configuration.  There was minimal bleeding.  After the prostate biopsy was complete his perineum and genitalia were prepped with Betadine solution and he was draped in the usual sterile fashion.  Cystoscopy was performed using a 23 [place] scope and 30 degree lens.  Examination revealed a normal urethra.  The prostatic urethra was approximately 3 cm in length with some lateral lobe enlargement.  The middle lobe was prominent and somewhat difficult to get over and appeared obstructing.  Once in the bladder he had mild to moderate trabeculation.  No tumors, stones or inflammation were noted.  The ureteral orifices were in the normal anatomic position.  The left ureteral orifice was cannulated with a 5 French opening catheter and contrast was instilled.  The left retrograde pyelogram demonstrated J hooking of the distal ureter with some tortuosity in the distal ureter as well.  There were no filling defects in the ureter had an otherwise normal caliber.  There was a filling defect in the renal pelvis consistent with the stone with minimal proximal obstruction.  The stone flushed into the upper pole during the procedure.  After completion the retrograde pyelogram a sensor wire was passed through the open-ended catheter but because the J hooking I was unable to negotiated beyond the intramural ureter.  I then changed an angled tipped Glidewire which was passed to the kidney.  The open-ended catheter was passed over the Glidewire to the kidney and the Glidewire was replaced with a sensor wire.  The open-ended catheter was removed.  A 55 cm 12/14 French digital access sheath inner core was then passed over the wire without difficulty to the kidney.  The sheath was assembled and inserted and once again it was passed without difficulty.  The inner core and wire were then removed.  The dual-lumen digital flexible scope was passed to the kidney there was some inflammatory  changes where the stone had been lodged but nothing to suggest a neoplasm.  The stone was now located in the upper pole and was relatively jagged in appearance.  The stone was engaged with a 200 m tract tip laser fiber with initial settings of 0.3 J and 53 Hz.  The energy was increased to 0.5 J to facilitate fragmentation.  The stone broke into manageable fragments which were removed with the engage basket.  Once all significant fragments were removed leaving only minor grit, the ureteroscope was removed over a wire with inspection of the ureter during removal.  No significant ureteral injury was noted.  The cystoscope was then reinserted over the wire and a 6 [place] by 26 cm contour double-J stent with tether was passed to the kidney under fluoroscopic guidance.  The wire was removed, leaving a good coil in the kidney and a good coil in the bladder.  The bladder was then drained and the cystoscope was removed.  The stent string was left exiting the urethral meatus and was secured to the penis with pink tape.  Patient was taken down from lithotomy position, his anesthetic was reversed and he was moved to recovery in stable condition.  There were no complications.  He will be given his stone fragments.

## 2019-11-17 NOTE — Interval H&P Note (Signed)
History and Physical Interval Note:  11/17/2019 10:30 AM  Jerry Walker  has presented today for surgery, with the diagnosis of ELEVATED PSA ,LEFT RENAL STONE.  The various methods of treatment have been discussed with the patient and family. After consideration of risks, benefits and other options for treatment, the patient has consented to  Procedure(s): BIOPSY TRANSRECTAL ULTRASONIC PROSTATE (TUBP) (N/A) URETEROSCOPY STONE EXTRACTION WITH HOLMIUM LASER LITHOTRIPSY AND STENT (Left) as a surgical intervention.  The patient's history has been reviewed, patient examined, no change in status, stable for surgery.  I have reviewed the patient's chart and labs.  Questions were answered to the patient's satisfaction.     Bjorn Pippin

## 2019-11-17 NOTE — Anesthesia Procedure Notes (Signed)
Procedure Name: LMA Insertion Date/Time: 11/17/2019 11:05 AM Performed by: Earmon Phoenix, CRNA Pre-anesthesia Checklist: Patient identified, Emergency Drugs available, Suction available, Patient being monitored and Timeout performed Patient Re-evaluated:Patient Re-evaluated prior to induction Oxygen Delivery Method: Circle system utilized Preoxygenation: Pre-oxygenation with 100% oxygen Induction Type: IV induction LMA: LMA inserted LMA Size: 4.0 Number of attempts: 1 Placement Confirmation: positive ETCO2,  CO2 detector and breath sounds checked- equal and bilateral Tube secured with: Tape Dental Injury: Teeth and Oropharynx as per pre-operative assessment

## 2019-11-17 NOTE — Discharge Instr - Supplementary Instructions (Signed)

## 2019-11-18 ENCOUNTER — Encounter (HOSPITAL_BASED_OUTPATIENT_CLINIC_OR_DEPARTMENT_OTHER): Payer: Self-pay | Admitting: Urology

## 2019-11-18 ENCOUNTER — Other Ambulatory Visit (HOSPITAL_COMMUNITY): Payer: 59

## 2019-11-18 LAB — SURGICAL PATHOLOGY

## 2019-11-19 NOTE — Anesthesia Postprocedure Evaluation (Signed)
Anesthesia Post Note  Patient: SAMUELL KNOBLE  Procedure(s) Performed: BIOPSY TRANSRECTAL ULTRASONIC PROSTATE (TUBP) (N/A Prostate) URETEROSCOPY STONE EXTRACTION, RETROGRADE WITH HOLMIUM LASER LITHOTRIPSY AND STENT (Left Ureter)     Patient location during evaluation: PACU Anesthesia Type: General Level of consciousness: sedated and patient cooperative Pain management: pain level controlled Vital Signs Assessment: post-procedure vital signs reviewed and stable Respiratory status: spontaneous breathing Cardiovascular status: stable Anesthetic complications: no   No complications documented.  Last Vitals:  Vitals:   11/17/19 1315 11/17/19 1358  BP: (!) 164/90 (!) 168/81  Pulse: (!) 55 (!) 54  Resp: 15 16  Temp:  36.7 C  SpO2: 99% 99%    Last Pain:  Vitals:   11/17/19 1358  TempSrc:   PainSc: 0-No pain                 Lewie Loron

## 2019-11-23 ENCOUNTER — Other Ambulatory Visit (HOSPITAL_COMMUNITY): Payer: 59

## 2019-11-24 ENCOUNTER — Ambulatory Visit: Admit: 2019-11-24 | Payer: 59 | Admitting: Urology

## 2019-11-24 SURGERY — BIOPSY, PROSTATE, RECTAL APPROACH, WITH US GUIDANCE
Anesthesia: Monitor Anesthesia Care

## 2019-11-29 ENCOUNTER — Ambulatory Visit (HOSPITAL_COMMUNITY): Admission: RE | Admit: 2019-11-29 | Payer: 59 | Source: Ambulatory Visit

## 2019-12-01 NOTE — Progress Notes (Signed)
Subjective: 1. Ureteral stone   2. Elevated PSA   3. BPH with urinary obstruction   4. Nocturia      Jerry Walker returns today in f/u from a prostate biopsy and left ureteroscopic stone extraction.  His prostate volume was 22ml and his biopsy was negative.   He was left with a stent with a tether. He has removed that as requested.   He has no pain but he can see a little bit of hematuria.  He continues to void well on tamsulosin.     Gu Hx: Jerry Walker is a 64 yo AAM who is sent in consultation by Dr. Sherryll Burger for an elevated PSA that was 5.2 in 1/20.  He had a workplace physical in February and it was up to 14.  He saw Dr. Sherryll Burger and had another PSA ordered in March but didn't get that done.     He had a positive Covid test in 05/25/19 and had infusion therapy and was pretty sick with it but has recovered.  He has moderate LUTS with an IPSS of 14.  He had seen Dr. Nechama Guard in the past and was placed on tamsulosin several years ago but Dr. Sherryll Burger increased it to 2 daily.  He is voiding better with the higher dose.  He has not had a prior prostate biopsy.  He has had no hematuria or dysuria.   His UA is heme and LE positive today.    ROS:  ROS  No Known Allergies  Past Medical History:  Diagnosis Date  . Asthma   . BPH (benign prostatic hyperplasia)   . History of 2019 novel coronavirus disease (COVID-19) 05/2019  . History of kidney stones   . Hypertension     Past Surgical History:  Procedure Laterality Date  . COLONOSCOPY    . CYSTOSCOPY WITH HOLMIUM LASER LITHOTRIPSY    . PROSTATE BIOPSY N/A 11/17/2019   Procedure: BIOPSY TRANSRECTAL ULTRASONIC PROSTATE (TUBP);  Surgeon: Bjorn Pippin, MD;  Location[facility];  Service: Urology;  Laterality: N/A;  . URETEROSCOPY WITH HOLMIUM LASER LITHOTRIPSY Left 11/17/2019   Procedure: URETEROSCOPY STONE EXTRACTION, RETROGRADE WITH HOLMIUM LASER LITHOTRIPSY AND STENT;  Surgeon: Bjorn Pippin, MD;  Location: [facility];   Service: Urology;  Laterality: Left;    Social History   Socioeconomic History  . Marital status: Single    Spouse name: Not on file  . Number of children: Not on file  . Years of education: Not on file  . Highest education level: Not on file  Occupational History  . Occupation: Chiropractor  Tobacco Use  . Smoking status: Never Smoker  . Smokeless tobacco: Never Used  Vaping Use  . Vaping Use: Never used  Substance and Sexual Activity  . Alcohol use: Never  . Drug use: Never  . Sexual activity: Not on file  Other Topics Concern  . Not on file  Social History Narrative  . Not on file   Social Determinants of Health   Financial Resource Strain:   . Difficulty of Paying Living Expenses:   Food Insecurity:   . Worried About Programme researcher, broadcasting/film/video in the Last Year:   . Barista in the Last Year:   Transportation Needs:   . Freight forwarder (Medical):   Marland Kitchen Lack of Transportation (Non-Medical):   Physical Activity:   . Days of Exercise per Week:   . Minutes of Exercise per Session:   Stress:   .  Feeling of Stress :   Social Connections:   . Frequency of Communication with Friends and Family:   . Frequency of Social Gatherings with Friends and Family:   . Attends Religious Services:   . Active Member of Clubs or Organizations:   . Attends Banker Meetings:   Marland Kitchen Marital Status:   Intimate Partner Violence:   . Fear of Current or Ex-Partner:   . Emotionally Abused:   Marland Kitchen Physically Abused:   . Sexually Abused:     No family history on file.  Anti-infectives: Anti-infectives (From admission, onward)   None      Current Outpatient Medications  Medication Sig Dispense Refill  . FLOVENT HFA 110 MCG/ACT inhaler Inhale 2 puffs into the lungs 2 (two) times daily.    . hydrochlorothiazide (HYDRODIURIL) 25 MG tablet Take 25 mg by mouth daily.    Marland Kitchen HYDROcodone-acetaminophen (NORCO/VICODIN) 5-325 MG tablet Take 1 tablet by mouth every 4 (four)  hours as needed for moderate pain. 15 tablet 0  . tamsulosin (FLOMAX) 0.4 MG CAPS capsule Take 0.8 mg by mouth every morning.      No current facility-administered medications for this visit.     Objective: Vital signs in last 24 hours: There were no vitals taken for this visit.  Intake/Output from previous day: No intake/output data recorded. Intake/Output this shift: @IOTHISSHIFT @   Physical Exam  Lab Results:  No results found for this or any previous visit (from the past 24 hour(s)).  BMET No results for input(s): NA, K, CL, CO2, GLUCOSE, BUN, CREATININE, CALCIUM in the last 72 hours. PT/INR No results for input(s): LABPROT, INR in the last 72 hours. ABG No results for input(s): PHART, HCO3 in the last 72 hours.  Invalid input(s): PCO2, PO2  Studies/Results: No results found.   Assessment/Plan: Ureteral stone He is doing well s/p ureteroscopy.  I will have him return in 1 month for a renal to make sure he hasn't developed a stricture.   Elevated PSA The biopsy was negative.  He will need a repeat PSA in 6 months.   BPH with urinary obstruction He continues to void well on tamsulosin 0.8mg  daily and will continue that.   .    No orders of the defined types were placed in this encounter.    Orders Placed This Encounter  Procedures  . US RENAL    Standing Status:   Future    Standing Expiration Date:   02/02/2020    Order Specific Question:   Reason for Exam (SYMPTOM  OR DIAGNOSIS REQUIRED)    Answer:   history of stones and recent ureteroscopy    Order Specific Question:   Preferred imaging location?    Answer:   [facility]     Return in about 4 weeks (around 12/30/2019) for 4-6 weeks with renal 01/01/2020 for UA. .    CC: Dr. [place].     Kirstie Peri 12/02/2019 [phone]

## 2019-12-02 ENCOUNTER — Ambulatory Visit (INDEPENDENT_AMBULATORY_CARE_PROVIDER_SITE_OTHER): Payer: 59 | Admitting: Urology

## 2019-12-02 ENCOUNTER — Other Ambulatory Visit: Payer: Self-pay

## 2019-12-02 DIAGNOSIS — N201 Calculus of ureter: Secondary | ICD-10-CM

## 2019-12-02 DIAGNOSIS — R972 Elevated prostate specific antigen [PSA]: Secondary | ICD-10-CM | POA: Insufficient documentation

## 2019-12-02 DIAGNOSIS — N401 Enlarged prostate with lower urinary tract symptoms: Secondary | ICD-10-CM | POA: Insufficient documentation

## 2019-12-02 DIAGNOSIS — R351 Nocturia: Secondary | ICD-10-CM

## 2019-12-02 DIAGNOSIS — N138 Other obstructive and reflux uropathy: Secondary | ICD-10-CM

## 2019-12-02 NOTE — Assessment & Plan Note (Signed)
The biopsy was negative.  He will need a repeat PSA in 6 months.

## 2019-12-02 NOTE — Assessment & Plan Note (Signed)
He continues to void well on tamsulosin 0.8mg  daily and will continue that.

## 2019-12-02 NOTE — Assessment & Plan Note (Signed)
He is doing well s/p ureteroscopy.  I will have him return in 1 month for a renal US to make sure he hasn't developed a stricture.

## 2020-01-13 ENCOUNTER — Ambulatory Visit (HOSPITAL_COMMUNITY)
Admission: RE | Admit: 2020-01-13 | Discharge: 2020-01-13 | Disposition: A | Discharge status: Home / Self Care | Payer: 59 | Source: Ambulatory Visit | Attending: Urology | Admitting: Urology

## 2020-01-13 ENCOUNTER — Other Ambulatory Visit: Payer: Self-pay

## 2020-01-13 DIAGNOSIS — N201 Calculus of ureter: Secondary | ICD-10-CM | POA: Diagnosis not present

## 2020-01-13 NOTE — Progress Notes (Signed)
Letter sent via my chart

## 2020-01-19 NOTE — Progress Notes (Signed)
Subjective: 1. Renal stones   2. BPH with urinary obstruction   3. Nocturia   4. Elevated PSA      [name] had left ureteroscopy on 7/15 and returns with a renal US today.   There is no residual left hydro or stones.  The right kidney has stable stones without obstruction.  His UA has 6-10 WBC's.  He has no flank pain or hematuria.   HE had a prostate biopsy as well for his elevated  PSA.  That was negative.  He has BPH with BOO.  He continues to void well on tamsulosin.     Gu Hx: Jerry Walker is a 64 yo AAM who is sent in consultation by Dr. Sherryll Walker for an elevated PSA that was 5.2 in 1/20.  He had a workplace physical in February and it was up to 14.  He saw Dr. Sherryll Walker and had another PSA ordered in March but didn't get that done.     He had a positive Covid test in 05/25/19 and had infusion therapy and was pretty sick with it but has recovered.  He has moderate LUTS with an IPSS of 14.  He had seen Dr. Nechama Walker in the past and was placed on tamsulosin several years ago but Dr. Sherryll Walker increased it to 2 daily.  He is voiding better with the higher dose.  He has not had a prior prostate biopsy.  He has had no hematuria or dysuria.   His UA is heme and LE positive today.    ROS:  ROS  No Known Allergies  Past Medical History:  Diagnosis Date  . Asthma   . BPH (benign prostatic hyperplasia)   . History of 2019 novel coronavirus disease (COVID-19) 05/2019  . History of kidney stones   . Hypertension     Past Surgical History:  Procedure Laterality Date  . COLONOSCOPY    . CYSTOSCOPY WITH HOLMIUM LASER LITHOTRIPSY    . PROSTATE BIOPSY N/A 11/17/2019   Procedure: BIOPSY TRANSRECTAL ULTRASONIC PROSTATE (TUBP);  Surgeon: Jerry Pippin, MD;  Location: [facility];  Service: Urology;  Laterality: N/A;  . URETEROSCOPY WITH HOLMIUM LASER LITHOTRIPSY Left 11/17/2019   Procedure: URETEROSCOPY STONE EXTRACTION, RETROGRADE WITH HOLMIUM LASER LITHOTRIPSY AND STENT;  Surgeon: Jerry Pippin, MD;   Location: [facility];  Service: Urology;  Laterality: Left;    Social History   Socioeconomic History  . Marital status: Single    Spouse name: Not on file  . Number of children: Not on file  . Years of education: Not on file  . Highest education level: Not on file  Occupational History  . Occupation: Chiropractor  Tobacco Use  . Smoking status: Never Smoker  . Smokeless tobacco: Never Used  Vaping Use  . Vaping Use: Never used  Substance and Sexual Activity  . Alcohol use: Never  . Drug use: Never  . Sexual activity: Not on file  Other Topics Concern  . Not on file  Social History Narrative  . Not on file   Social Determinants of Health   Financial Resource Strain:   . Difficulty of Paying Living Expenses: Not on file  Food Insecurity:   . Worried About Programme researcher, broadcasting/film/video in the Last Year: Not on file  . Ran Out of Food in the Last Year: Not on file  Transportation Needs:   . Lack of Transportation (Medical): Not on file  . Lack of Transportation (Non-Medical): Not on file  Physical Activity:   . Days of Exercise per Week: Not on file  . Minutes of Exercise per Session: Not on file  Stress:   . Feeling of Stress : Not on file  Social Connections:   . Frequency of Communication with Friends and Family: Not on file  . Frequency of Social Gatherings with Friends and Family: Not on file  . Attends Religious Services: Not on file  . Active Member of Clubs or Organizations: Not on file  . Attends Banker Meetings: Not on file  . Marital Status: Not on file  Intimate Partner Violence:   . Fear of Current or Ex-Partner: Not on file  . Emotionally Abused: Not on file  . Physically Abused: Not on file  . Sexually Abused: Not on file    History reviewed. No pertinent family history.  Anti-infectives: Anti-infectives (From admission, onward)   None      Current Outpatient Medications  Medication Sig Dispense Refill  . FLOVENT  HFA 110 MCG/ACT inhaler Inhale 2 puffs into the lungs 2 (two) times daily.    . hydrochlorothiazide (HYDRODIURIL) 25 MG tablet Take 25 mg by mouth daily.    . tamsulosin (FLOMAX) 0.4 MG CAPS capsule Take 0.8 mg by mouth every morning.      No current facility-administered medications for this visit.     Objective: Vital signs in last 24 hours: BP (!) 150/76   Pulse 63   Temp 98 F (36.7 C)   Ht 5\' 9"  (1.753 m)   Wt 179 lb 9.6 oz (81.5 kg)   BMI 26.52 kg/m   Intake/Output from previous day: No intake/output data recorded. Intake/Output this shift: @IOTHISSHIFT @   Physical Exam  Lab Results:  Results for orders placed or performed in visit on 01/20/20 (from the past 24 hour(s))  Urinalysis, Routine w reflex microscopic     Status: Abnormal   Collection Time: 01/20/20  9:37 AM  Result Value Ref Range   Specific Gravity, UA 1.025 1.005 - 1.030   pH, UA 5.5 5.0 - 7.5   Color, UA Yellow Yellow   Appearance Ur Clear Clear   Leukocytes,UA Trace (A) Negative   Protein,UA 1+ (A) Negative/Trace   Glucose, UA Negative Negative   Ketones, UA Negative Negative   RBC, UA Negative Negative   Bilirubin, UA Negative Negative   Urobilinogen, Ur 0.2 0.2 - 1.0 mg/dL   Nitrite, UA Negative Negative   Microscopic Examination See below:    Narrative   Performed at:  [address] - [facility] [address], [place], [address]  [facility] Director: [place], Phone:  [phone]  Microscopic Examination     Status: Abnormal   Collection Time: 01/20/20  9:37 AM   Urine  Result Value Ref Range   WBC, UA 6-10 (A) 0 - 5 /hpf   RBC None seen 0 - 2 /hpf   Epithelial Cells (non renal) None seen 0 - 10 /hpf   Renal Epithel, UA None seen None seen /hpf   Mucus, UA Present Not Estab.   Bacteria, UA None seen None seen/Few   Narrative   Performed at:  [address] - [facility]  [address] [place], [address]  [facility] Director: [place], Phone:  [phone]     BMET No results for input(s): NA, K, CL, CO2, GLUCOSE, BUN, CREATININE, CALCIUM in the last 72 hours. PT/INR No results for input(s): LABPROT, INR in the last 72 hours. ABG No results for input(s):  PHART, HCO3 in the last 72 hours.  Invalid input(s): PCO2, PO2  Studies/Results: DATA:  Kidney stones.  Recent ureteroscopy and lithotripsy.  EXAM: RENAL / URINARY TRACT ULTRASOUND COMPLETE  COMPARISON:  CT abdomen pelvis without and with contrast 10/18/2019  FINDINGS: Right Kidney:  Renal measurements: 10.1 x 5.5 x 5.9 cm = volume: 170 mL. Renal parenchyma is within normal limits. Multiple shadowing stones are present. The largest at the upper pole measuring 10 mm. 7 mm stone is present in the midportion of the right kidney. 9 mm stone is present at the lower portion of the right kidney. Multiple other smaller artifact scratched at multiple other smaller stones are present. No obstruction is present.  Left Kidney:  Renal measurements: 12.4 x 5.9 x 7.1 cm = volume: 273 mL. Renal parenchyma is of normal echotexture. Previously noted hydro nephrosis has resolved.  Bladder:  Appears normal for degree of bladder distention.  Other:  None.  IMPRESSION: 1. Multiple residual nonobstructing stones at the right kidney measuring up to 10 mm. 2. Resolution of previously noted left-sided hydronephrosis.   Electronically Signed   By: Marin Roberts M.D.   On: 01/13/2020 12:13   Assessment/Plan: Nephrolithiasis:   He has no recurrent stone or hydro on the left and stable stones on the right.  I will get a KUB in 17months.  Elevated PSA:   He will return with a PSA in 3 months and 6 months.  BPH with BOO and nocturia:  He will continue tamsulosin.  .    No orders of the defined types were placed in this encounter.    Orders Placed This Encounter  Procedures  . Microscopic Examination  . DG Abd 1 View    Standing Status:   Future    Standing  Expiration Date:   01/19/2021    Order Specific Question:   Reason for Exam (SYMPTOM  OR DIAGNOSIS REQUIRED)    Answer:   renal stone f/u.    Order Specific Question:   Preferred imaging location?    Answer:   [facility]    Order Specific Question:   Radiology Contrast Protocol - do NOT remove file path    Answer:   [email or web address]  . Urinalysis, Routine w reflex microscopic  . PSA, total and free    Standing Status:   Future    Standing Expiration Date:   01/19/2021     Return in about 6 months (around 07/19/2020) for Keep f/u with PSA in December.   He will then need f/u in March with a PSA and KUB. .    CC: Dr. Kirstie Peri.     Jerry Walker 01/20/2020 [phone]

## 2020-01-20 ENCOUNTER — Encounter: Payer: Self-pay | Admitting: Urology

## 2020-01-20 ENCOUNTER — Other Ambulatory Visit: Payer: Self-pay

## 2020-01-20 ENCOUNTER — Ambulatory Visit (INDEPENDENT_AMBULATORY_CARE_PROVIDER_SITE_OTHER): Payer: 59 | Admitting: Urology

## 2020-01-20 VITALS — BP 150/76 | HR 63 | Temp 98.0°F | Ht 69.0 in | Wt 179.6 lb

## 2020-01-20 DIAGNOSIS — N200 Calculus of kidney: Secondary | ICD-10-CM | POA: Diagnosis not present

## 2020-01-20 DIAGNOSIS — R351 Nocturia: Secondary | ICD-10-CM

## 2020-01-20 DIAGNOSIS — R972 Elevated prostate specific antigen [PSA]: Secondary | ICD-10-CM

## 2020-01-20 DIAGNOSIS — N401 Enlarged prostate with lower urinary tract symptoms: Secondary | ICD-10-CM | POA: Diagnosis not present

## 2020-01-20 DIAGNOSIS — N138 Other obstructive and reflux uropathy: Secondary | ICD-10-CM

## 2020-01-20 LAB — MICROSCOPIC EXAMINATION
Bacteria, UA: NONE SEEN
Epithelial Cells (non renal): NONE SEEN /hpf (ref 0–10)
RBC, Urine: NONE SEEN /hpf (ref 0–2)
Renal Epithel, UA: NONE SEEN /hpf

## 2020-01-20 LAB — URINALYSIS, ROUTINE W REFLEX MICROSCOPIC
Bilirubin, UA: NEGATIVE
Glucose, UA: NEGATIVE
Ketones, UA: NEGATIVE
Nitrite, UA: NEGATIVE
RBC, UA: NEGATIVE
Specific Gravity, UA: 1.025 (ref 1.005–1.030)
Urobilinogen, Ur: 0.2 mg/dL (ref 0.2–1.0)
pH, UA: 5.5 (ref 5.0–7.5)

## 2020-01-20 NOTE — Progress Notes (Signed)
Urological Symptom Review  Patient is experiencing the following symptoms: Frequent urination Get up at night to urinate  Kidney stones   Review of Systems  Gastrointestinal (upper)  : Negative for upper GI symptoms  Gastrointestinal (lower) : Negative for lower GI symptoms  Constitutional : Negative for symptoms  Skin: Negative for skin symptoms  Eyes: Negative for eye symptoms  Ear/Nose/Throat : Negative for Ear/Nose/Throat symptoms  Hematologic/Lymphatic: Negative for Hematologic/Lymphatic symptoms  Cardiovascular : Negative for cardiovascular symptoms  Respiratory : Negative for respiratory symptoms  Endocrine: Negative for endocrine symptoms  Musculoskeletal: Negative for musculoskeletal symptoms  Neurological: Negative for neurological symptoms  Psychologic: Negative for psychiatric symptoms  

## 2020-03-23 ENCOUNTER — Other Ambulatory Visit: Payer: Self-pay

## 2020-03-23 DIAGNOSIS — N201 Calculus of ureter: Secondary | ICD-10-CM

## 2020-03-23 DIAGNOSIS — R972 Elevated prostate specific antigen [PSA]: Secondary | ICD-10-CM

## 2020-04-02 ENCOUNTER — Other Ambulatory Visit: Payer: 59

## 2020-04-02 ENCOUNTER — Other Ambulatory Visit: Payer: Self-pay

## 2020-04-02 DIAGNOSIS — R972 Elevated prostate specific antigen [PSA]: Secondary | ICD-10-CM

## 2020-04-03 LAB — PSA: Prostate Specific Ag, Serum: 7.7 ng/mL — ABNORMAL HIGH (ref 0.0–4.0)

## 2020-04-06 ENCOUNTER — Ambulatory Visit: Payer: 59 | Admitting: Urology

## 2020-06-07 ENCOUNTER — Ambulatory Visit: Payer: 59 | Admitting: Urology

## 2020-06-27 NOTE — Progress Notes (Unsigned)
Subjective: 1. BPH with urinary obstruction   2. Elevated PSA   3. Nocturia   4. Renal stones   5. Microhematuria      Mr. Valvano had left ureteroscopy on 11/17/19 and returns today in f/u.  He was supposed to have a KUB prior to this visit but I don't see that was done.  His UA has 11-30 RBC's and a PSA on 04/02/20 was 7.7 which is down from the high of 14 in 2/21 but I did a biopsy that was negative.  His prostate volume was 39ml.  He had another PSA on 06/25/20 for work that was 8.9.   He has known right renal stones.  He has no flank pain or hematuria.    He has BPH with BOO.  He continues to void well on tamsulosin.  His IPSS is 10 with nocturia x 3.     Gu Hx: Mr. Penkala is a 65 yo AAM who is sent in consultation by Dr. Sherryll Burger for an elevated PSA that was 5.2 in 1/20.  He had a workplace physical in February and it was up to 14.  He saw Dr. Sherryll Burger and had another PSA ordered in March but didn't get that done.     He had a positive Covid test in 05/25/19 and had infusion therapy and was pretty sick with it but has recovered.  He has moderate LUTS with an IPSS of 14.  He had seen Dr. Nechama Guard in the past and was placed on tamsulosin several years ago but Dr. Sherryll Burger increased it to 2 daily.  He is voiding better with the higher dose.  He has not had a prior prostate biopsy.  He has had no hematuria or dysuria.   His UA is heme and LE positive today.    ROS:  ROS  No Known Allergies  Past Medical History:  Diagnosis Date  . Asthma   . BPH (benign prostatic hyperplasia)   . History of 2019 novel coronavirus disease (COVID-19) 05/2019  . History of kidney stones   . Hypertension     Past Surgical History:  Procedure Laterality Date  . COLONOSCOPY    . CYSTOSCOPY WITH HOLMIUM LASER LITHOTRIPSY    . PROSTATE BIOPSY N/A 11/17/2019   Procedure: BIOPSY TRANSRECTAL ULTRASONIC PROSTATE (TUBP);  Surgeon: Bjorn Pippin, MD;  Location[facility];  Service: Urology;  Laterality: N/A;  .  URETEROSCOPY WITH HOLMIUM LASER LITHOTRIPSY Left 11/17/2019   Procedure: URETEROSCOPY STONE EXTRACTION, RETROGRADE WITH HOLMIUM LASER LITHOTRIPSY AND STENT;  Surgeon: Bjorn Pippin, MD;  Location: [facility];  Service: Urology;  Laterality: Left;    Social History   Socioeconomic History  . Marital status: Single    Spouse name: Not on file  . Number of children: Not on file  . Years of education: Not on file  . Highest education level: Not on file  Occupational History  . Occupation: Chiropractor  Tobacco Use  . Smoking status: Never Smoker  . Smokeless tobacco: Never Used  Vaping Use  . Vaping Use: Never used  Substance and Sexual Activity  . Alcohol use: Never  . Drug use: Never  . Sexual activity: Not on file  Other Topics Concern  . Not on file  Social History Narrative  . Not on file   Social Determinants of Health   Financial Resource Strain: Not on file  Food Insecurity: Not on file  Transportation Needs: Not on file  Physical Activity: Not on  file  Stress: Not on file  Social Connections: Not on file  Intimate Partner Violence: Not on file    History reviewed. No pertinent family history.  Anti-infectives: Anti-infectives (From admission, onward)   None      Current Outpatient Medications  Medication Sig Dispense Refill  . FLOVENT HFA 110 MCG/ACT inhaler Inhale 2 puffs into the lungs 2 (two) times daily.    . hydrochlorothiazide (HYDRODIURIL) 25 MG tablet Take 25 mg by mouth daily.    . tamsulosin (FLOMAX) 0.4 MG CAPS capsule Take 0.8 mg by mouth every morning.      No current facility-administered medications for this visit.     Objective: Vital signs in last 24 hours: BP 137/81   Pulse 66   Temp 98.4 F (36.9 C)   Ht 5\' 9"  (1.753 m)   Wt 188 lb (85.3 kg)   BMI 27.76 kg/m   Intake/Output from previous day: No intake/output data recorded. Intake/Output this shift: @IOTHISSHIFT @   Physical Exam  Lab Results:   Results for orders placed or performed in visit on 06/28/20 (from the past 24 hour(s))  Urinalysis, Routine w reflex microscopic     Status: Abnormal   Collection Time: 06/28/20  3:38 PM  Result Value Ref Range   Specific Gravity, UA >1.030 (H) 1.005 - 1.030   pH, UA 5.5 5.0 - 7.5   Color, UA Yellow Yellow   Appearance Ur Clear Clear   Leukocytes,UA Trace (A) Negative   Protein,UA 1+ (A) Negative/Trace   Glucose, UA Negative Negative   Ketones, UA Negative Negative   RBC, UA 1+ (A) Negative   Bilirubin, UA Negative Negative   Urobilinogen, Ur 0.2 0.2 - 1.0 mg/dL   Nitrite, UA Negative Negative   Microscopic Examination See below:    Narrative   Performed at:  [address] - [facility] [address], [place], [address]  [facility] Director: [place], Phone:  [phone]  Microscopic Examination     Status: Abnormal   Collection Time: 06/28/20  3:38 PM   Urine  Result Value Ref Range   WBC, UA 0-5 0 - 5 /hpf   RBC 11-30 (A) 0 - 2 /hpf   Epithelial Cells (non renal) None seen 0 - 10 /hpf   Renal Epithel, UA None seen None seen /hpf   Mucus, UA Present Not Estab.   Bacteria, UA None seen None seen/Few   Narrative   Performed at:  [address] - [facility] [address] [place], [address]  [facility] Director: [place], Phone:  [phone]    BMET No results for input(s): NA, K, CL, CO2, GLUCOSE, BUN, CREATININE, CALCIUM in the last 72 hours. PT/INR No results for input(s): LABPROT, INR in the last 72 hours. ABG No results for input(s): PHART, HCO3 in the last 72 hours.  Invalid input(s): PCO2, PO2  Studies/Results: DATA:  Kidney stones.  Recent ureteroscopy and lithotripsy.  EXAM: RENAL / URINARY TRACT ULTRASOUND COMPLETE  COMPARISON:  CT abdomen pelvis without and with contrast 10/18/2019  FINDINGS: Right Kidney:  Renal measurements: 10.1 x 5.5 x 5.9 cm = volume: 170 mL. Renal parenchyma is within normal limits. Multiple shadowing  stones are present. The largest at the upper pole measuring 10 mm. 7 mm stone is present in the midportion of the right kidney. 9 mm stone is present at the lower portion of the right kidney. Multiple other smaller artifact scratched at multiple other smaller stones are present. No obstruction is  present.  Left Kidney:  Renal measurements: 12.4 x 5.9 x 7.1 cm = volume: 273 mL. Renal parenchyma is of normal echotexture. Previously noted hydro nephrosis has resolved.  Bladder:  Appears normal for degree of bladder distention.  Other:  None.  IMPRESSION: 1. Multiple residual nonobstructing stones at the right kidney measuring up to 10 mm. 2. Resolution of previously noted left-sided hydronephrosis.   Electronically Signed   By: Marin Roberts M.D.   On: 01/13/2020 12:13   Assessment/Plan: Nephrolithiasis:   He has some microhematuria but no flank pain.  I will get a KUB and call with the results.   Elevated PSA with negative biopsy.  His PSA is up from the prebiopsy level but below the high.   He will need a repeat PSA in  6 months and return for an exam.  BPH with BOO and nocturia:  He will continue tamsulosin which he is now taking 2 daily  .    No orders of the defined types were placed in this encounter.    Orders Placed This Encounter  Procedures  . Microscopic Examination  . DG Abd 1 View    Standing Status:   Future    Standing Expiration Date:   07/26/2020    Order Specific Question:   Reason for Exam (SYMPTOM  OR DIAGNOSIS REQUIRED)    Answer:   renal stones    Order Specific Question:   Preferred imaging location?    Answer:   [facility]    Order Specific Question:   Radiology Contrast Protocol - do NOT remove file path    Answer:   [email or web address]  . Urinalysis, Routine w reflex microscopic  . PSA, total and free    Standing Status:   Future    Standing Expiration Date:    06/28/2021     Return in about 6 months (around 12/26/2020) for with PSA.    CC: Dr. Kirstie Peri.     Bjorn Pippin 06/28/2020 [phone]

## 2020-06-28 ENCOUNTER — Other Ambulatory Visit: Payer: Self-pay

## 2020-06-28 ENCOUNTER — Encounter: Payer: Self-pay | Admitting: Urology

## 2020-06-28 ENCOUNTER — Ambulatory Visit (INDEPENDENT_AMBULATORY_CARE_PROVIDER_SITE_OTHER): Payer: 59 | Admitting: Urology

## 2020-06-28 VITALS — BP 137/81 | HR 66 | Temp 98.4°F | Ht 69.0 in | Wt 188.0 lb

## 2020-06-28 DIAGNOSIS — N401 Enlarged prostate with lower urinary tract symptoms: Secondary | ICD-10-CM | POA: Diagnosis not present

## 2020-06-28 DIAGNOSIS — R3129 Other microscopic hematuria: Secondary | ICD-10-CM

## 2020-06-28 DIAGNOSIS — N200 Calculus of kidney: Secondary | ICD-10-CM | POA: Diagnosis not present

## 2020-06-28 DIAGNOSIS — R351 Nocturia: Secondary | ICD-10-CM | POA: Diagnosis not present

## 2020-06-28 DIAGNOSIS — R972 Elevated prostate specific antigen [PSA]: Secondary | ICD-10-CM

## 2020-06-28 DIAGNOSIS — N138 Other obstructive and reflux uropathy: Secondary | ICD-10-CM

## 2020-06-28 LAB — URINALYSIS, ROUTINE W REFLEX MICROSCOPIC
Bilirubin, UA: NEGATIVE
Glucose, UA: NEGATIVE
Ketones, UA: NEGATIVE
Nitrite, UA: NEGATIVE
Specific Gravity, UA: >1.03 — ABNORMAL HIGH (ref 1.005–1.030)
Urobilinogen, Ur: 0.2 mg/dL (ref 0.2–1.0)
pH, UA: 5.5 (ref 5.0–7.5)

## 2020-06-28 LAB — MICROSCOPIC EXAMINATION
Bacteria, UA: NONE SEEN
Epithelial Cells (non renal): NONE SEEN /hpf (ref 0–10)
Renal Epithel, UA: NONE SEEN /hpf

## 2020-06-28 NOTE — Progress Notes (Signed)
Urological Symptom Review  Patient is experiencing the following symptoms: Frequency Getting up at night   Review of Systems  Gastrointestinal (upper)  : Negative for upper GI symptoms  Gastrointestinal (lower) : Negative for lower GI symptoms  Constitutional : Negative for symptoms  Skin: Negative for skin symptoms  Eyes: Negative for eye symptoms  Ear/Nose/Throat : Negative for Ear/Nose/Throat symptoms  Hematologic/Lymphatic: Negative for Hematologic/Lymphatic symptoms  Cardiovascular : Negative for cardiovascular symptoms  Respiratory : Negative for respiratory symptoms  Endocrine: Negative for endocrine symptoms  Musculoskeletal: Negative for musculoskeletal symptoms  Neurological: Negative for neurological symptoms  Psychologic: Negative for psychiatric symptoms 

## 2020-12-19 ENCOUNTER — Other Ambulatory Visit: Payer: 59

## 2020-12-21 ENCOUNTER — Other Ambulatory Visit: Payer: 59

## 2020-12-21 ENCOUNTER — Other Ambulatory Visit: Payer: Self-pay

## 2020-12-21 DIAGNOSIS — R972 Elevated prostate specific antigen [PSA]: Secondary | ICD-10-CM

## 2020-12-22 LAB — PSA, TOTAL AND FREE
PSA, Free Pct: 18 %
PSA, Free: 1.17 ng/mL
Prostate Specific Ag, Serum: 6.5 ng/mL — ABNORMAL HIGH (ref 0.0–4.0)

## 2020-12-27 ENCOUNTER — Other Ambulatory Visit: Payer: Self-pay

## 2020-12-27 ENCOUNTER — Ambulatory Visit: Payer: 59 | Admitting: Urology

## 2020-12-27 VITALS — BP 130/81 | HR 72

## 2020-12-27 DIAGNOSIS — R972 Elevated prostate specific antigen [PSA]: Secondary | ICD-10-CM | POA: Diagnosis not present

## 2020-12-27 DIAGNOSIS — N200 Calculus of kidney: Secondary | ICD-10-CM | POA: Diagnosis not present

## 2020-12-27 DIAGNOSIS — N401 Enlarged prostate with lower urinary tract symptoms: Secondary | ICD-10-CM | POA: Diagnosis not present

## 2020-12-27 DIAGNOSIS — R351 Nocturia: Secondary | ICD-10-CM | POA: Diagnosis not present

## 2020-12-27 DIAGNOSIS — N138 Other obstructive and reflux uropathy: Secondary | ICD-10-CM

## 2020-12-27 LAB — MICROSCOPIC EXAMINATION
Bacteria, UA: NONE SEEN
RBC, Urine: >30 /hpf — AB (ref 0–2)
Renal Epithel, UA: NONE SEEN /hpf

## 2020-12-27 LAB — URINALYSIS, ROUTINE W REFLEX MICROSCOPIC
Bilirubin, UA: NEGATIVE
Glucose, UA: NEGATIVE
Nitrite, UA: NEGATIVE
Specific Gravity, UA: 1.025 (ref 1.005–1.030)
Urobilinogen, Ur: 0.2 mg/dL (ref 0.2–1.0)
pH, UA: 6.5 (ref 5.0–7.5)

## 2020-12-27 NOTE — Progress Notes (Signed)
Subjective: 1. Elevated PSA   2. Renal stones   3. BPH with urinary obstruction   4. Nocturia    12/27/20: Jerry Walker returns today in f/u.   He had a PSA on 12/21/20 that is down to 6.5.   He is voiding well with an IPSS of 3 and has had no hematuria or flank pain.  He didn't get the KUB for f/u of his renal stones that I ordered at his last visit.  He remains on tamsulosin 0.8mg  daily for the BPH with BOO.   His UA has >30 RBC and 6-10 WBC, but no bacteria.   06/28/20: Jerry Walker had left ureteroscopy on 11/17/19 and returns today in f/u.  He was supposed to have a KUB prior to this visit but I don't see that was done.  His UA has 11-30 RBC's and a PSA on 04/02/20 was 7.7 which is down from the high of 14 in 2/21 but I did a biopsy that was negative.  His prostate volume was 21ml.  He had another PSA on 06/25/20 for work that was 8.9.   He has known right renal stones.  He has no flank pain or hematuria.    He has BPH with BOO.  He continues to void well on tamsulosin.  His IPSS is 10 with nocturia x 3.     Gu Hx: Jerry Walker is a 65 yo AAM who is sent in consultation by Dr. Sherryll Burger for an elevated PSA that was 5.2 in 1/20.  He had a workplace physical in February and it was up to 14.  He saw Dr. Sherryll Burger and had another PSA ordered in March but didn't get that done.     He had a positive Covid test in 05/25/19 and had infusion therapy and was pretty sick with it but has recovered.  He has moderate LUTS with an IPSS of 14.  He had seen Dr. Nechama Guard in the past and was placed on tamsulosin several years ago but Dr. Sherryll Burger increased it to 2 daily.  He is voiding better with the higher dose.  He has not had a prior prostate biopsy.  He has had no hematuria or dysuria.   His UA is heme and LE positive today.     IPSS     Row Name 12/27/20 1500         International Prostate Symptom Score   How often have you had the sensation of not emptying your bladder? Less than 1 in 5     How often have you had to urinate less  than every two hours? Not at All     How often have you found you stopped and started again several times when you urinated? Not at All     How often have you found it difficult to postpone urination? Less than 1 in 5 times     How often have you had a weak urinary stream? Not at All     How often have you had to strain to start urination? Not at All     How many times did you typically get up at night to urinate? 1 Time     Total IPSS Score 3           Quality of Life due to urinary symptoms   If you were to spend the rest of your life with your urinary condition just the way it is now how would you feel about that? Mostly Satisfied  ROS:  ROS  No Known Allergies  Past Medical History:  Diagnosis Date   Asthma    BPH (benign prostatic hyperplasia)    History of 2019 novel coronavirus disease (COVID-19) 05/2019   History of kidney stones    Hypertension     Past Surgical History:  Procedure Laterality Date   COLONOSCOPY     CYSTOSCOPY WITH HOLMIUM LASER LITHOTRIPSY     PROSTATE BIOPSY N/A 11/17/2019   Procedure: BIOPSY TRANSRECTAL ULTRASONIC PROSTATE (TUBP);  Surgeon: Bjorn Pippin, MD;  Location: [facility];  Service: Urology;  Laterality: N/A;   URETEROSCOPY WITH HOLMIUM LASER LITHOTRIPSY Left 11/17/2019   Procedure: URETEROSCOPY STONE EXTRACTION, RETROGRADE WITH HOLMIUM LASER LITHOTRIPSY AND STENT;  Surgeon: Bjorn Pippin, MD;  Location: [facility];  Service: Urology;  Laterality: Left;    Social History   Socioeconomic History   Marital status: Single    Spouse name: Not on file   Number of children: Not on file   Years of education: Not on file   Highest education level: Not on file  Occupational History   Occupation: Engineer, 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  Anti-infectives: Anti-infectives (From admission, onward)    None       Current Outpatient Medications  Medication Sig Dispense Refill   FLOVENT HFA 110 MCG/ACT inhaler Inhale 2 puffs into the lungs 2 (two) times daily.     hydrochlorothiazide (HYDRODIURIL) 25 MG tablet Take 25 mg by mouth daily.     tamsulosin (FLOMAX) 0.4 MG CAPS capsule Take 0.8 mg by mouth every morning.      No current facility-administered medications for this visit.     Objective: Vital signs in last 24 hours: BP 130/81   Pulse 72   Intake/Output from previous day: No intake/output data recorded. Intake/Output this shift: @IOTHISSHIFT @   Physical Exam  Lab Results:  Results for orders placed or performed in visit on 12/27/20 (from the past 24 hour(s))  Urinalysis, Routine w reflex microscopic     Status: Abnormal   Collection Time: 12/27/20  3:32 PM  Result Value Ref Range   Specific Gravity, UA 1.025 1.005 - 1.030   pH, UA 6.5 5.0 - 7.5   Color, UA Amber (A) Yellow   Appearance Ur Clear Clear   Leukocytes,UA 1+ (A) Negative   Protein,UA 2+ (A) Negative/Trace   Glucose, UA Negative Negative   Ketones, UA Trace (A) Negative   RBC, UA 3+ (A) Negative   Bilirubin, UA Negative Negative   Urobilinogen, Ur 0.2 0.2 - 1.0 mg/dL   Nitrite, UA Negative Negative   Microscopic Examination See below:    Narrative   Performed at:  [address] - [facility] [address], [place], [place]  [place] Lab Director: [id number] [place], Phone:  [phone]  Microscopic Examination  Status: Abnormal    Collection Time: 12/27/20  3:32 PM   Urine  Result Value Ref Range   WBC, UA 6-10 (A) 0 - 5 /hpf   RBC >30 (A) 0 - 2 /hpf   Epithelial Cells (non renal) 0-10 0 - 10 /hpf   Renal Epithel, UA None seen None seen /hpf   Mucus, UA Present Not Estab.   Bacteria, UA None seen None seen/Few   Narrative   Performed at:  [address] - [facility] [address] [place], [place]  [id number] Lab Director: Chinita Pester [place], Phone:  [phone]     BMET No results for input(s): NA, K, CL, CO2, GLUCOSE, BUN, CREATININE, CALCIUM in the last 72 hours. PT/INR No results for input(s): LABPROT, INR in the last 72 hours. ABG No results for input(s): PHART, HCO3 in the last 72 hours.  Invalid input(s): PCO2, PO2  Studies/Results: DATA:  Kidney stones.  Recent ureteroscopy and lithotripsy.   EXAM: RENAL / URINARY TRACT ULTRASOUND COMPLETE   COMPARISON:  CT abdomen pelvis without and with contrast 10/18/2019   FINDINGS: Right Kidney:   Renal measurements: 10.1 x 5.5 x 5.9 cm = volume: 170 mL. Renal parenchyma is within normal limits. Multiple shadowing stones are present. The largest at the upper pole measuring 10 mm. 7 mm stone is present in the midportion of the right kidney. 9 mm stone is present at the lower portion of the right kidney. Multiple other smaller artifact scratched at multiple other smaller stones are present. No obstruction is present.   Left Kidney:   Renal measurements: 12.4 x 5.9 x 7.1 cm = volume: 273 mL. Renal parenchyma is of normal echotexture. Previously noted hydro nephrosis has resolved.   Bladder:   Appears normal for degree of bladder distention.   Other:   None.   IMPRESSION: 1. Multiple residual nonobstructing stones at the right kidney measuring up to 10 mm. 2. Resolution of previously noted left-sided hydronephrosis.     Electronically Signed   By: Marin Roberts M.D.   On: 01/13/2020 12:13   Assessment/Plan: Nephrolithiasis:    He has some microhematuria but no flank pain.  I will get a KUB and call with the results.   Elevated PSA with negative biopsy.  His PSA is down with a mid range f/t ratio.  I will recheck in 6 months and do an exam.  BPH with BOO and nocturia:  He will continue tamsulosin which he is now taking 2 daily  .    No orders of the defined types were placed in this encounter.    Orders Placed This Encounter  Procedures   Microscopic Examination   DG Abd 1 View    Standing Status:   Future    Standing Expiration Date:   12/27/2021    Order Specific Question:   Reason for Exam (SYMPTOM  OR DIAGNOSIS REQUIRED)    Answer:   renal stones    Order Specific Question:   Preferred imaging location?    Answer:   [facility]    Order Specific Question:   Radiology Contrast Protocol - do NOT remove file path    Answer:   [email or web address]   Urinalysis, Routine w reflex microscopic   PSA, total and free    Standing Status:   Future    Standing Expiration Date:   12/27/2021     Return in about 6 months (around 06/29/2021) for with PSA.    CC: Dr. Kirstie Peri.  Bjorn PippinJohn Darnelle Walker 12/27/2020 [phone]

## 2020-12-27 NOTE — Progress Notes (Signed)

## 2021-06-27 ENCOUNTER — Other Ambulatory Visit: Payer: 59

## 2021-06-28 ENCOUNTER — Other Ambulatory Visit: Payer: 59

## 2021-07-04 ENCOUNTER — Ambulatory Visit: Payer: 59 | Admitting: Urology

## 2021-07-26 ENCOUNTER — Other Ambulatory Visit: Payer: 59

## 2021-08-01 ENCOUNTER — Ambulatory Visit: Payer: 59 | Admitting: Urology

## 2021-08-23 ENCOUNTER — Other Ambulatory Visit: Payer: 59

## 2021-08-29 ENCOUNTER — Ambulatory Visit: Payer: 59 | Admitting: Urology

## 2021-09-06 ENCOUNTER — Other Ambulatory Visit: Payer: 59

## 2021-09-06 DIAGNOSIS — R972 Elevated prostate specific antigen [PSA]: Secondary | ICD-10-CM

## 2021-09-07 LAB — PSA, TOTAL AND FREE
PSA, Free Pct: 16.2 %
PSA, Free: 1.41 ng/mL
Prostate Specific Ag, Serum: 8.7 ng/mL — ABNORMAL HIGH (ref 0.0–4.0)

## 2021-09-12 ENCOUNTER — Encounter: Payer: Self-pay | Admitting: Urology

## 2021-09-12 ENCOUNTER — Ambulatory Visit: Payer: 59 | Admitting: Urology

## 2021-09-12 VITALS — BP 129/74 | HR 71

## 2021-09-12 DIAGNOSIS — R972 Elevated prostate specific antigen [PSA]: Secondary | ICD-10-CM

## 2021-09-12 DIAGNOSIS — R351 Nocturia: Secondary | ICD-10-CM | POA: Diagnosis not present

## 2021-09-12 DIAGNOSIS — N401 Enlarged prostate with lower urinary tract symptoms: Secondary | ICD-10-CM

## 2021-09-12 DIAGNOSIS — N200 Calculus of kidney: Secondary | ICD-10-CM | POA: Diagnosis not present

## 2021-09-12 DIAGNOSIS — N138 Other obstructive and reflux uropathy: Secondary | ICD-10-CM

## 2021-09-12 LAB — URINALYSIS, ROUTINE W REFLEX MICROSCOPIC
Bilirubin, UA: NEGATIVE
Glucose, UA: NEGATIVE
Ketones, UA: NEGATIVE
Leukocytes,UA: NEGATIVE
Nitrite, UA: NEGATIVE
RBC, UA: NEGATIVE
Specific Gravity, UA: 1.02 (ref 1.005–1.030)
Urobilinogen, Ur: 0.2 mg/dL (ref 0.2–1.0)
pH, UA: 7 (ref 5.0–7.5)

## 2021-09-12 NOTE — Progress Notes (Signed)
?Subjective: ?1. Elevated PSA   ?2. BPH with urinary obstruction   ?3. Nocturia   ?4. Renal stones   ? ?09/12/21: Jerry Walker returns today in f/u for his history of an elevated PSA, BPH with BOO and stones.  His PSA is 8.7 with a 16.2% f/t ratio on 09/06/21.  It was 6.5 in 8/22 and 7.7 in 11/21.  He has had chronic microhematuria but the UA is clear today.  He had a negative prostate biopsy at the time of ureteroscopy in 7/21.  His prostate volume was 75ml but there was 76ml middle lobe as well.  He has been on tamsulosin 0.8mg  daily.  ? ?12/27/20: Jerry Walker returns today in f/u.   He had a PSA on 12/21/20 that is down to 6.5.   He is voiding well with an IPSS of 3 and has had no hematuria or flank pain.  He didn't get the KUB for f/u of his renal stones that I ordered at his last visit.  He remains on tamsulosin 0.8mg  daily for the BPH with BOO.   His UA has >30 RBC and 6-10 WBC, but no bacteria.  ? ?06/28/20: Jerry Walker had left ureteroscopy on 11/17/19 and returns today in f/u.  He was supposed to have a KUB prior to this visit but I don't see that was done.  His UA has 11-30 RBC's and a PSA on 04/02/20 was 7.7 which is down from the high of 14 in 2/21 but I did a biopsy that was negative.  His prostate volume was 37ml.  He had another PSA on 06/25/20 for work that was 8.9.   He has known right renal stones.  He has no flank pain or hematuria.    He has BPH with BOO.  He continues to void well on tamsulosin.  His IPSS is 10 with nocturia x 3.   ? ? ?Gu Hx: Jerry Walker is a 66 yo AAM who is sent in consultation by Dr. Manuella Ghazi for an elevated PSA that was 5.2 in 1/20.  He had a workplace physical in February and it was up to 14.  He saw Dr. Manuella Ghazi and had another PSA ordered in March but didn't get that done.     He had a positive Covid test in 05/25/19 and had infusion therapy and was pretty sick with it but has recovered.  He has moderate LUTS with an IPSS of 14.  He had seen Dr. Exie Parody in the past and was placed on tamsulosin  several years ago but Dr. Manuella Ghazi increased it to 2 daily.  He is voiding better with the higher dose.  He has not had a prior prostate biopsy.  He has had no hematuria or dysuria.   His UA is heme and LE positive today.   ? ? IPSS   ? ? [place] Name 09/12/21 1500  ?  ?  ?  ? International Prostate Symptom Score  ? How often have you had the sensation of not emptying your bladder? Less than 1 in 5    ? How often have you had to urinate less than every two hours? Less than half the time    ? How often have you found you stopped and started again several times when you urinated? Not at All    ? How often have you found it difficult to postpone urination? Less than half the time    ? How often have you had a weak urinary stream? Less than 1 in  5 times    ? How often have you had to strain to start urination? Not at All    ? How many times did you typically get up at night to urinate? 1 Time    ? Total IPSS Score 7    ?  ? Quality of Life due to urinary symptoms  ? If you were to spend the rest of your life with your urinary condition just the way it is now how would you feel about that? Mostly Satisfied    ? ?  ?  ? ?  ? ? ? ?ROS: ? ?ROS ? ?No Known Allergies ? ?Past Medical History:  ?Diagnosis Date  ? Asthma   ? BPH (benign prostatic hyperplasia)   ? History of 2019 novel coronavirus disease (COVID-19) 05/2019  ? History of kidney stones   ? Hypertension   ? ? ?Past Surgical History:  ?Procedure Laterality Date  ? COLONOSCOPY    ? CYSTOSCOPY WITH HOLMIUM LASER LITHOTRIPSY    ? PROSTATE BIOPSY N/A 11/17/2019  ? Procedure: BIOPSY TRANSRECTAL ULTRASONIC PROSTATE (TUBP);  Surgeon: Irine Seal, MD;  Location: [facility];  Service: Urology;  Laterality: N/A;  ? URETEROSCOPY WITH HOLMIUM LASER LITHOTRIPSY Left 11/17/2019  ? Procedure: URETEROSCOPY STONE EXTRACTION, RETROGRADE WITH HOLMIUM LASER LITHOTRIPSY AND STENT;  Surgeon: Irine Seal, MD;  Location: [facility];  Service: Urology;  Laterality:  Left;  ? ? ?Social History  ? ?Socioeconomic History  ? Marital status: Single  ?  Spouse name: Not on file  ? Number of children: Not on file  ? Years of education: Not on file  ? Highest education level: Not on file  ?Occupational History  ? Occupation: Banker company  ?Tobacco Use  ? Smoking status: Never  ? Smokeless tobacco: Never  ?Vaping Use  ? Vaping Use: Never used  ?Substance and Sexual Activity  ? Alcohol use: Never  ? Drug use: Never  ? Sexual activity: Not on file  ?Other Topics Concern  ? Not on file  ?Social History Narrative  ? Not on file  ? ?Social Determinants of Health  ? ?Financial Resource Strain: Not on file  ?Food Insecurity: Not on file  ?Transportation Needs: Not on file  ?Physical Activity: Not on file  ?Stress: Not on file  ?Social Connections: Not on file  ?Intimate Partner Violence: Not on file  ? ? ?History reviewed. No pertinent family history. ? ?Anti-infectives: ?Anti-infectives (From admission, onward)  ? ? None  ? ?  ? ? ?Current Outpatient Medications  ?Medication Sig Dispense Refill  ? FLOVENT HFA 110 MCG/ACT inhaler Inhale 2 puffs into the lungs 2 (two) times daily.    ? hydrochlorothiazide (HYDRODIURIL) 25 MG tablet Take 25 mg by mouth daily.    ? tamsulosin (FLOMAX) 0.4 MG CAPS capsule Take 0.8 mg by mouth every morning.     ? ?No current facility-administered medications for this visit.  ? ? ? ?Objective: ?Vital signs in last 24 hours: ?BP 129/74   Pulse 71  ? ?Intake/Output from previous day: ?No intake/output data recorded. ?Intake/Output this shift: ?@IOTHISSHIFT @ ? ? ?Physical Exam ?Vitals reviewed.  ?Constitutional:   ?   Appearance: Normal appearance.  ?Genitourinary: ?   Comments: AP without lesions. ?NST without mass. ?Prostate 2+ benign. ?SV non-palpable.  ?Neurological:  ?   Mental Status: He is alert.  ? ? ?Lab Results:  ?Results for orders placed or performed in visit on 09/12/21 (from the past 24 hour(s))  ?  Urinalysis, Routine w reflex microscopic     Status:  Abnormal  ? Collection Time: 09/12/21  3:54 PM  ?Result Value Ref Range  ? Specific Gravity, UA 1.020 1.005 - 1.030  ? pH, UA 7.0 5.0 - 7.5  ? Color, UA Yellow Yellow  ? Appearance Ur Clear Clear  ? Leukocytes,UA Negative Negative  ? Protein,UA Trace (A) Negative/Trace  ? Glucose, UA Negative Negative  ? Ketones, UA Negative Negative  ? RBC, UA Negative Negative  ? Bilirubin, UA Negative Negative  ? Urobilinogen, Ur 0.2 0.2 - 1.0 mg/dL  ? Nitrite, UA Negative Negative  ? Microscopic Examination Comment   ? Narrative  ? Performed at:  [facility] ?[address], [place], [place]  [id number] ?Lab Director: [place], Phone:  [phone]  ? ? ?  ?BMET ?No results for input(s): NA, K, CL, CO2, GLUCOSE, BUN, CREATININE, CALCIUM in the last 72 hours. ?PT/INR ?No results for input(s): LABPROT, INR in the last 72 hours. ?ABG ?No results for input(s): PHART, HCO3 in the last 72 hours. ? ?Invalid input(s): PCO2, PO2 ? ?Studies/Results: ?DATA:  Kidney stones.  Recent ureteroscopy and lithotripsy. ?  ?EXAM: ?RENAL / URINARY TRACT ULTRASOUND COMPLETE ?  ?COMPARISON:  CT abdomen pelvis without and with contrast 10/18/2019 ?  ?FINDINGS: ?Right Kidney: ?  ?Renal measurements: 10.1 x 5.5 x 5.9 cm = volume: 170 mL. Renal ?parenchyma is within normal limits. Multiple shadowing stones are ?present. The largest at the upper pole measuring 10 mm. 7 mm stone ?is present in the midportion of the right kidney. 9 mm stone is ?present at the lower portion of the right kidney. Multiple other ?smaller artifact scratched at multiple other smaller stones are ?present. No obstruction is present. ?  ?Left Kidney: ?  ?Renal measurements: 12.4 x 5.9 x 7.1 cm = volume: 273 mL. Renal ?parenchyma is of normal echotexture. Previously noted hydro ?nephrosis has resolved. ?  ?Bladder: ?  ?Appears normal for degree of bladder distention. ?  ?Other: ?  ?None. ?  ?IMPRESSION: ?1. Multiple residual nonobstructing stones at the right  kidney ?measuring up to 10 mm. ?2. Resolution of previously noted left-sided hydronephrosis. ?  ?  ?Electronically Signed ?  By: San Morelle M.D. ?  On: 01/13/2020 12:13 ? ? ?Assessment/Plan: ?Nephroli

## 2022-03-06 ENCOUNTER — Other Ambulatory Visit: Payer: 59

## 2022-03-20 ENCOUNTER — Other Ambulatory Visit: Payer: 59

## 2022-04-03 ENCOUNTER — Ambulatory Visit: Payer: 59 | Admitting: Urology

## 2022-04-19 IMAGING — CT CT ABD-PEL WO/W CM
3 of 12 series · 10 of 46 positions shown, 16 images · IV contrast (Omnipaque or Isovue)
Comparison: CT from 1881.  Report available at this time.

CLINICAL DATA: Hematuria.  Urinary frequency.

EXAM:
CT ABDOMEN AND PELVIS WITHOUT AND WITH CONTRAST
TECHNIQUE: Multidetector CT imaging of the abdomen and pelvis was performed
following the standard protocol before and following the bolus
administration of intravenous contrast.
CONTRAST:  125mL OMNIPAQUE IOHEXOL 300 MG/ML  SOLN

[Series 2: axial pre · axial · non-contrast · 0.83mm/px · z∈[-367,-72]mm · 4 of 99 slices shown, 9 images]
[im 20/99  soft-tissue]
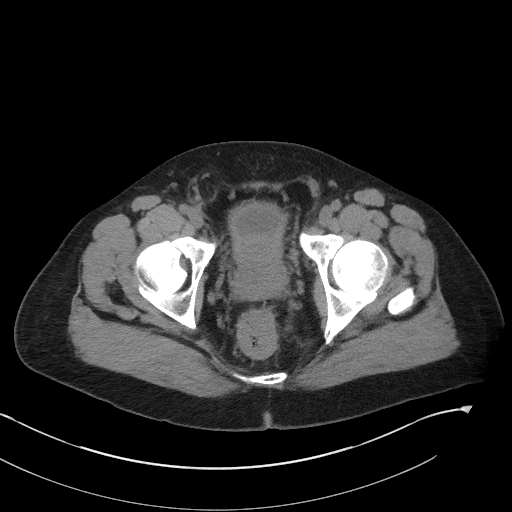
[im 20/99  lung]
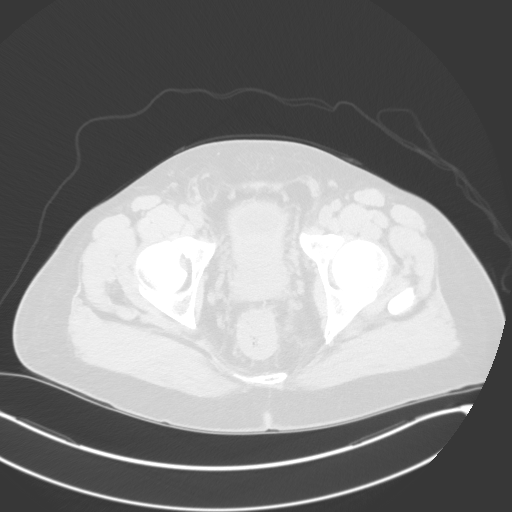
[im 20/99  bone]
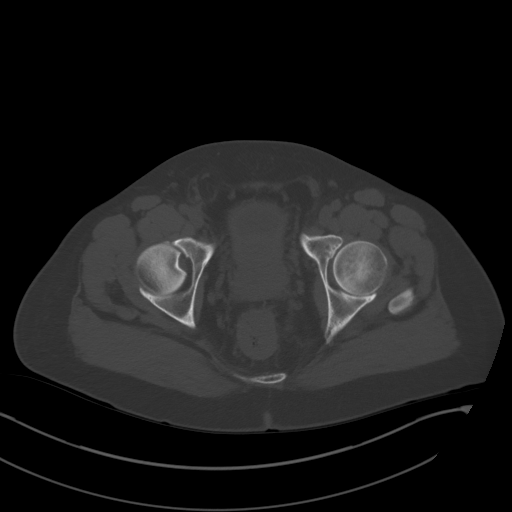
[im 40/99  soft-tissue]
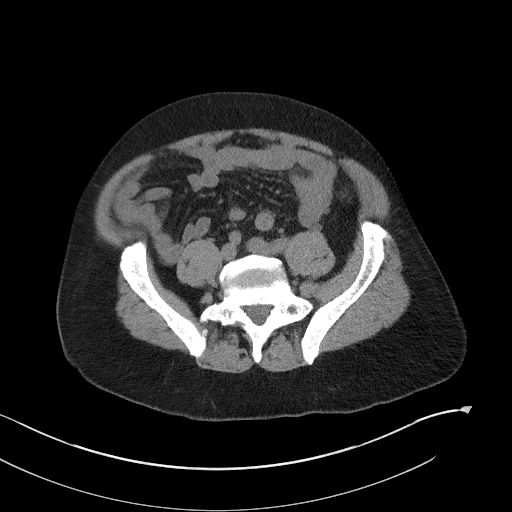
[im 40/99  lung]
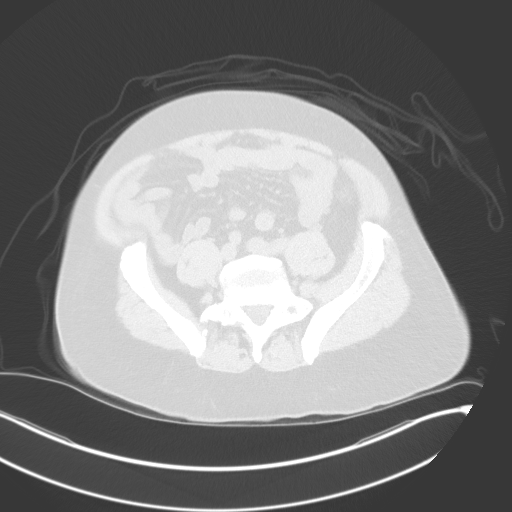
[im 59/99  soft-tissue]
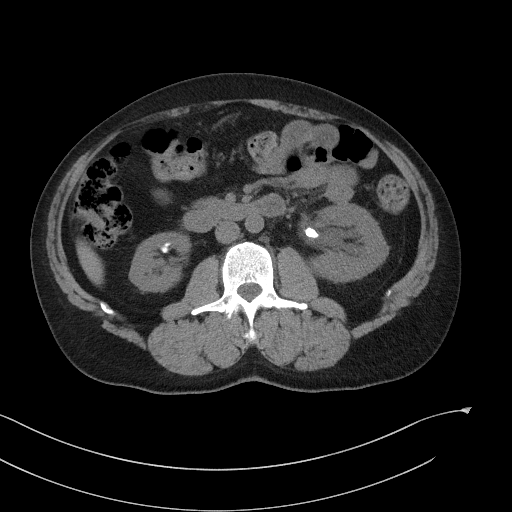
[im 59/99  lung]
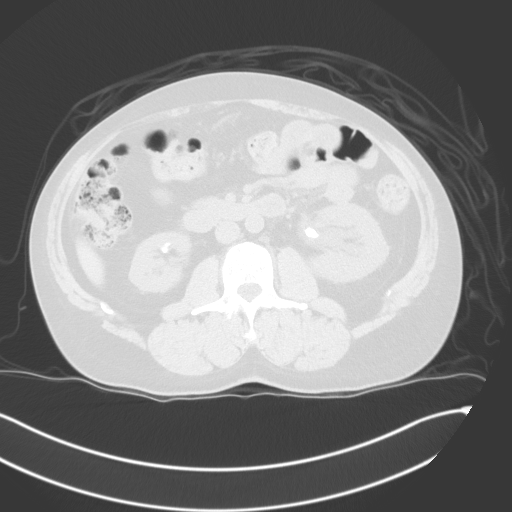
[im 79/99  soft-tissue]
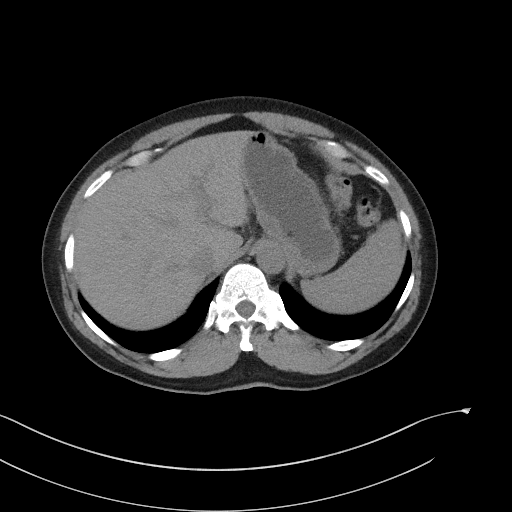
[im 79/99  lung]
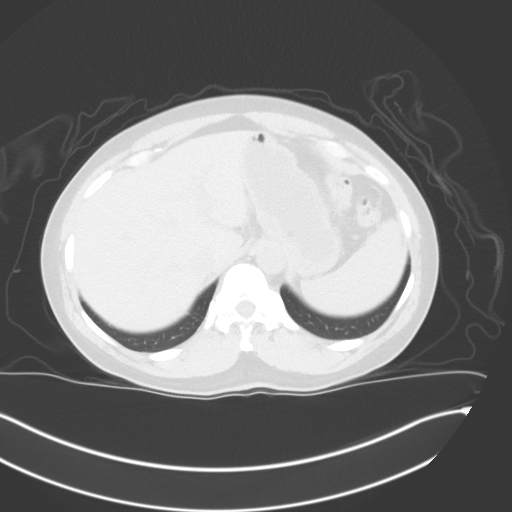

[Series 5: coronal pre · coronal · non-contrast · 0.91mm/px · 2 of 89 slices shown, 3 images]
[im 30/89  soft-tissue]
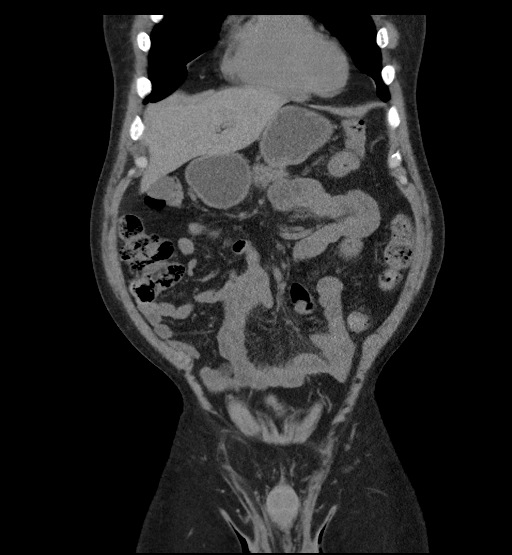
[im 30/89  bone]
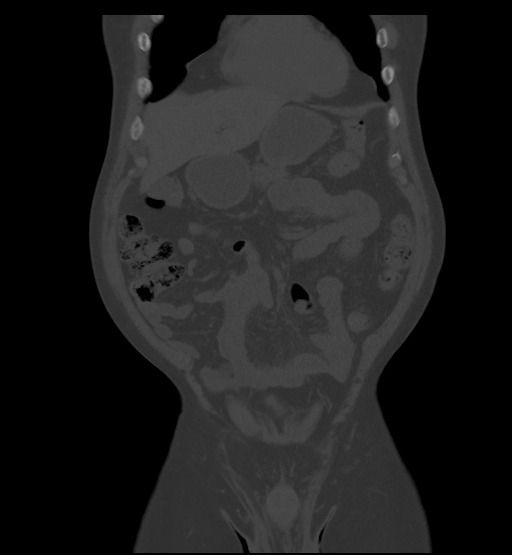
[im 59/89  soft-tissue]
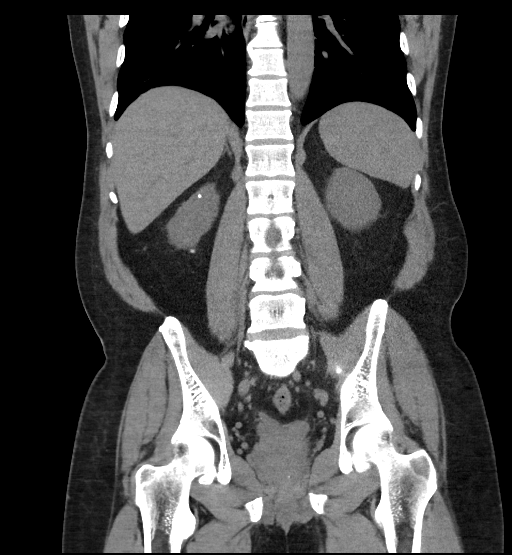

[Series 7: axial post · axial · 0.83mm/px · z∈[-372,-72]mm · 4 of 100 slices shown]
[im 20/100  soft-tissue]
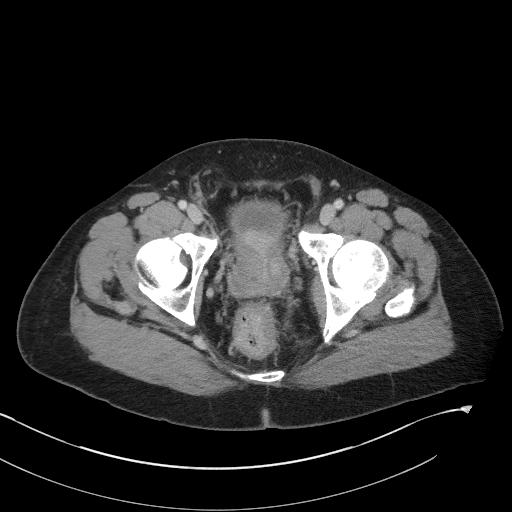
[im 40/100  soft-tissue]
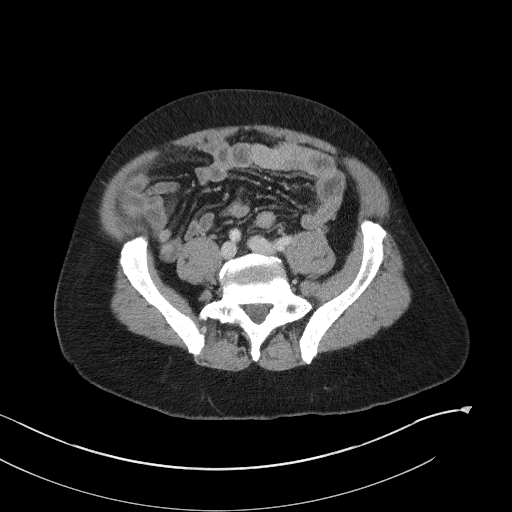
[im 60/100  soft-tissue]
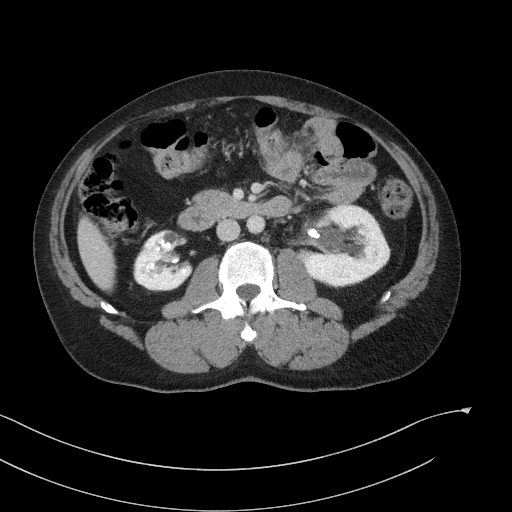
[im 80/100  soft-tissue]
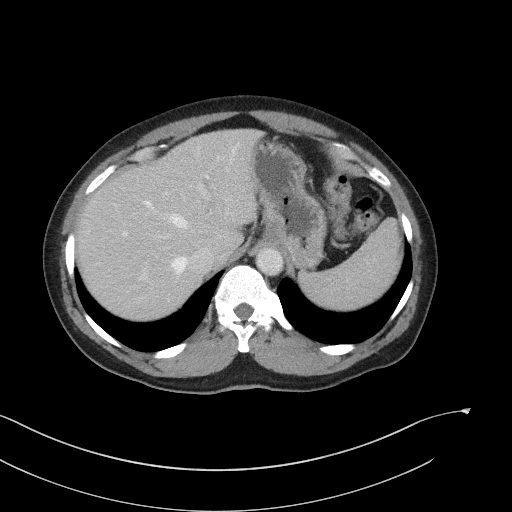

[10 of 46 positions shown; findings below may reference images not displayed]

FINDINGS: Lower chest: No dense consolidation or pleural effusion. Basilar
atelectasis.

Hepatobiliary: Enhancing lesion arising from the lateral segment of
the LEFT hepatic lobe (image 18, series 7) lobular contours and
matching blood pool on venous phase and without washout on delayed
phase. No additional hepatic lesion. No biliary ductal dilation. No
pericholecystic stranding.

Pancreas: Pancreas is normal without focal lesion or ductal
dilation.

Spleen: Spleen normal size without focal lesion.

Adrenals/Urinary Tract: Adrenal glands are normal.

Nephrolithiasis bilaterally.

Moderately large calculus in the LEFT renal pelvis 11 x 7 mm just
proximal to the UPJ and associated with mild collecting system
distension and peripelvic stranding. No additional calculi in the
ureter.

Numerous calculi in the atrophic and scarred RIGHT kidney with
moderate scarring, greater than 10, largest in the lower pole
measuring approximately 1 cm with at least 4 calculi greater than 5
mm. No ureteral calculi on the RIGHT.

Stomach/Bowel: Rectal thickening versus internal hemorrhoids with
numerous Peri rectal collaterals.

Mesenteric edema, global without significant bowel wall thickening
and without loss of bowel wall enhancement. Normal appendix.

Vascular/Lymphatic: Vascular structures in the abdomen are patent.
No adenopathy in the retroperitoneum.

No adenopathy in the pelvis.

Reproductive: Heterogeneity of the prostate is a nonspecific finding
on CT. There is more focal enhancement at the bladder base that is
suspected to represent a hypertrophied median lobe. There is diffuse
bladder wall thickening. Multiple pelvic collateral vessels also
accentuate distal ureteral and bladder wall thickening. The lumen of
the distal ureters on the excretory phase portion of the examination
is smooth.

Other: No ascites.  Moderate RIGHT fat containing inguinal hernia.

Musculoskeletal: No acute musculoskeletal process. Spinal
degenerative changes.
IMPRESSION: 1. Moderately large calculus in the LEFT renal pelvis just proximal
to the UPJ and associated with mild hydronephrosis and peripelvic
stranding. Enhancement remain symmetric and contrast is present
beyond the calculus.
2. Numerous calculi in the RIGHT kidney greater than 10, largest in
the lower pole measuring approximately 1 cm with at least 4 calculi
greater than 5 mm.
3. Numerous venous collaterals in the pelvis exaggerate the
appearance of the rectal and and the urinary bladder wall.
Significance and etiology is uncertain as there is no clear
explanation for the findings seen on the current exam. Note that the
prostate and bladder base are heterogeneous as described.
Cystoscopic assessment of the urinary bladder may be helpful in
addition to assessment of the prostate gland which has a nonspecific
appearance on CT but is indistinct on today's study.
4. Enhancing lesion arising from the lateral segment of the LEFT
hepatic lobe likely a hemangioma. Consider follow-up MRI for further
assessment.
5. Rectal thickening favored to represent internal hemorrhoids given
the numerous venous collaterals in the pelvis. Correlation with
recent colonoscopy results and or patient history may be helpful.
6. Mild congestive changes are suggested in the abdominal mesentery
of the small bowel without focal lesion. No nodularity in the
peritoneal cavity elsewhere or other suspicious finding such as
ascites. Correlate with any abdominal symptoms, similar changes
though less pronounced were present in 1881.

## 2022-04-25 ENCOUNTER — Other Ambulatory Visit: Payer: 59

## 2022-04-26 LAB — PSA, TOTAL AND FREE
PSA, Free Pct: 20.3 %
PSA, Free: 1.48 ng/mL
Prostate Specific Ag, Serum: 7.3 ng/mL — ABNORMAL HIGH (ref 0.0–4.0)

## 2022-05-01 ENCOUNTER — Ambulatory Visit: Payer: 59 | Admitting: Urology

## 2022-05-22 ENCOUNTER — Ambulatory Visit: Payer: 59 | Admitting: Urology

## 2022-07-15 IMAGING — US US RENAL
1 series · 14 of 25 positions shown · non-contrast
Comparison: CT abdomen pelvis without and with contrast 10/18/2019

CLINICAL DATA: Kidney stones.  Recent ureteroscopy and lithotripsy.

EXAM:
RENAL / URINARY TRACT ULTRASOUND COMPLETE

[Series 1: us renal · 0.21mm/px · 14 of 57 slices shown]
[im 1/57]
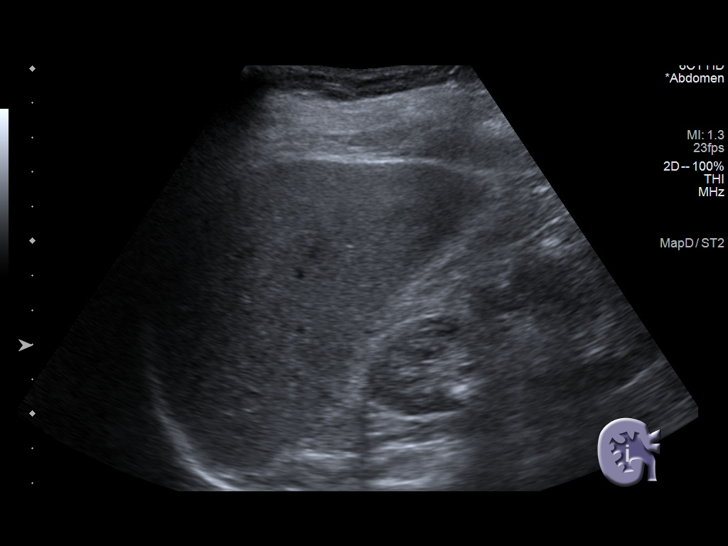
[im 5/57]
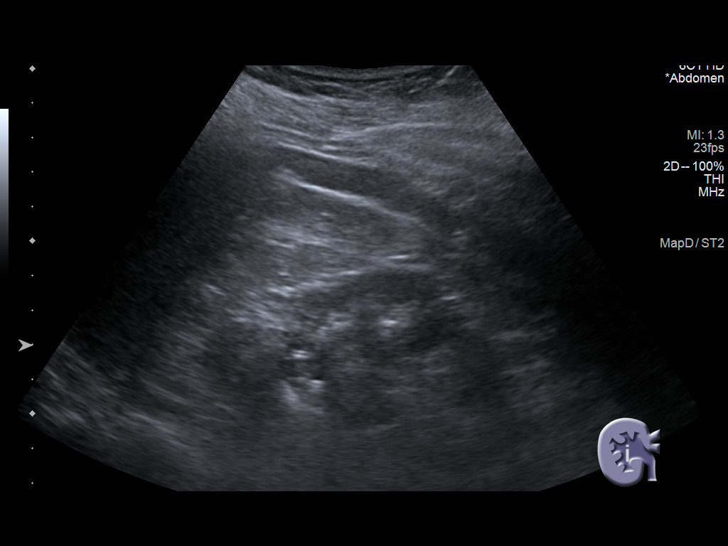
[im 10/57]
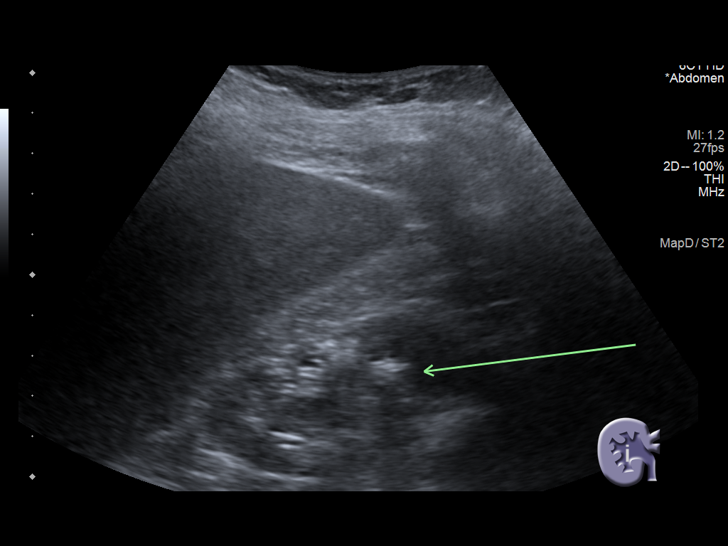
[im 15/57]
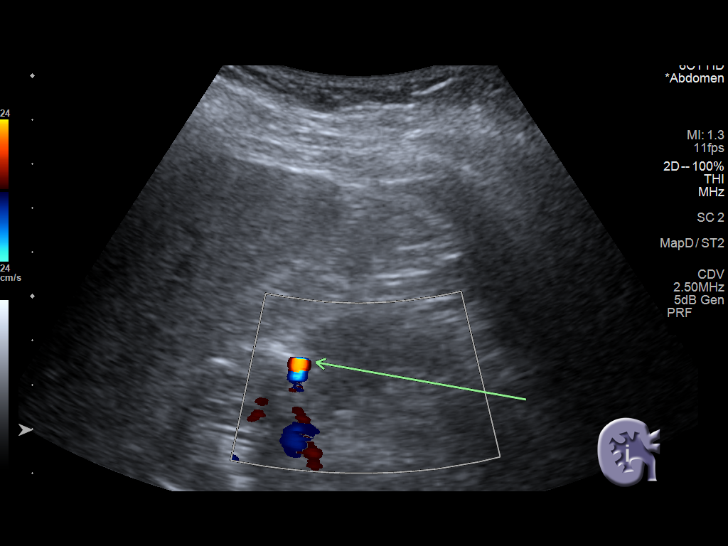
[im 19/57]
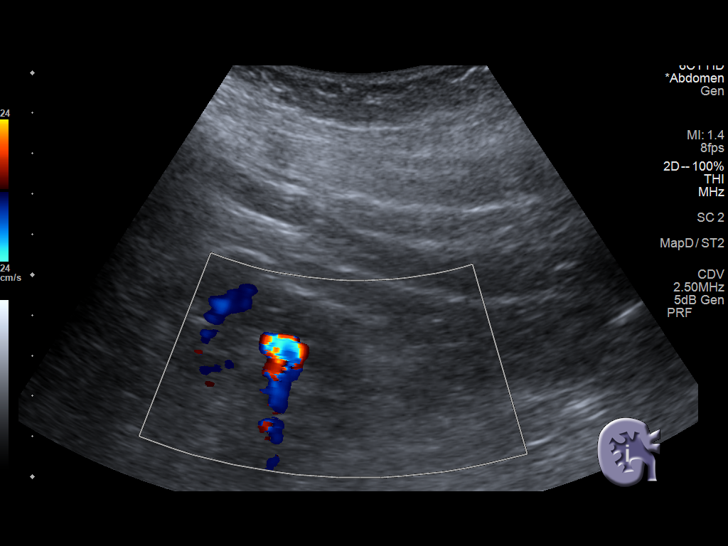
[im 22/57]
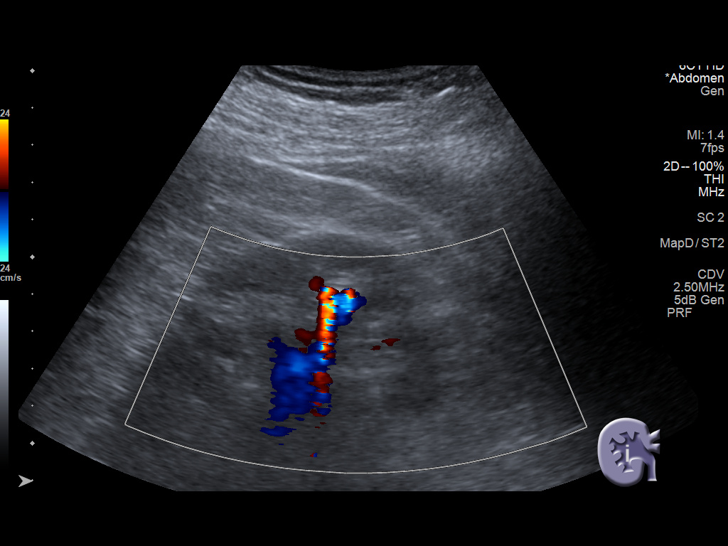
[im 26/57]
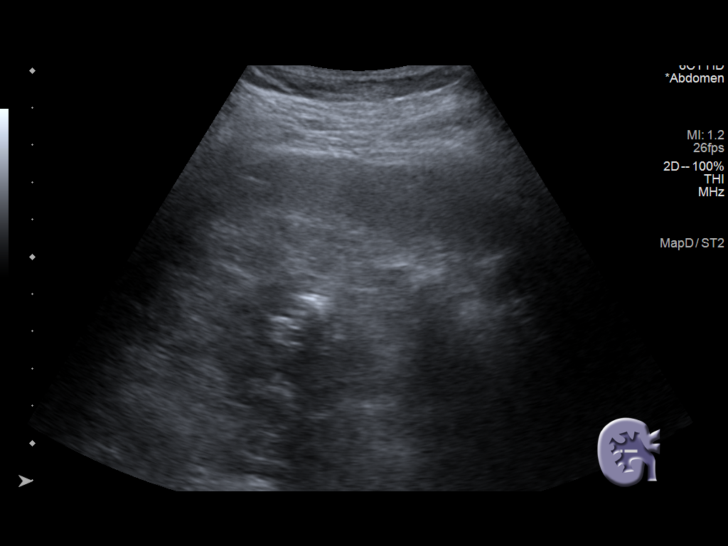
[im 31/57]
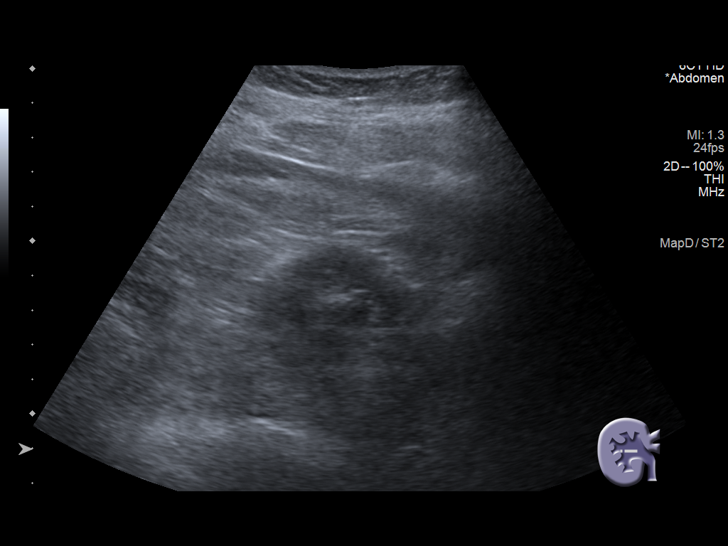
[im 36/57]
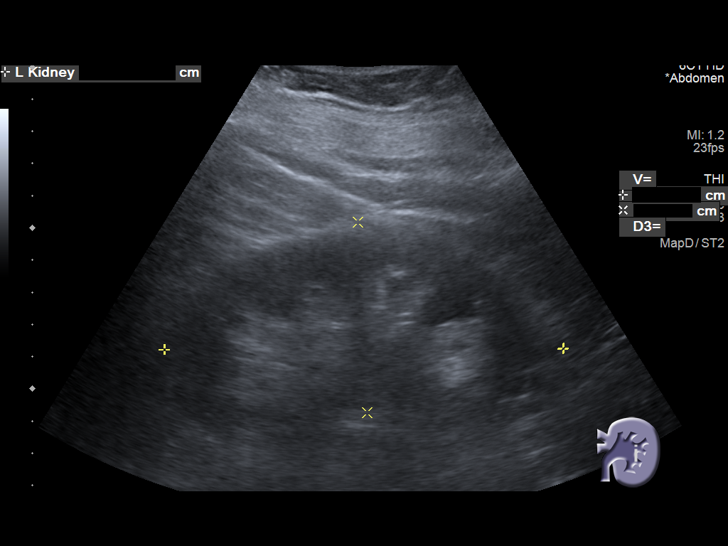
[im 38/57]
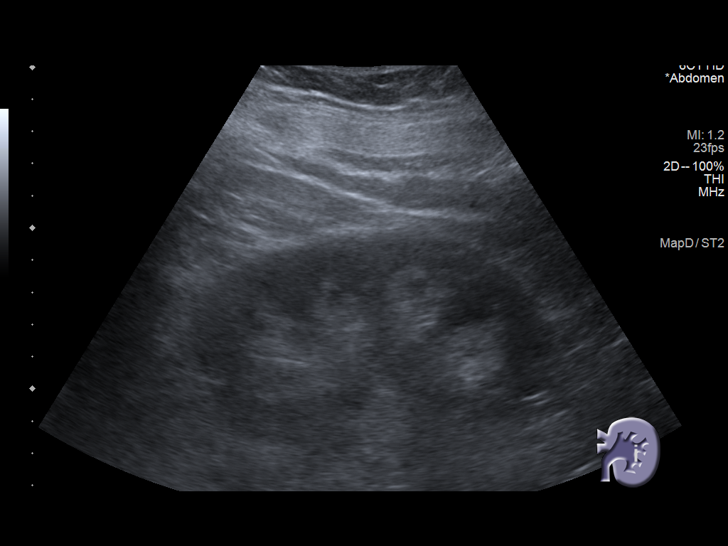
[im 43/57]
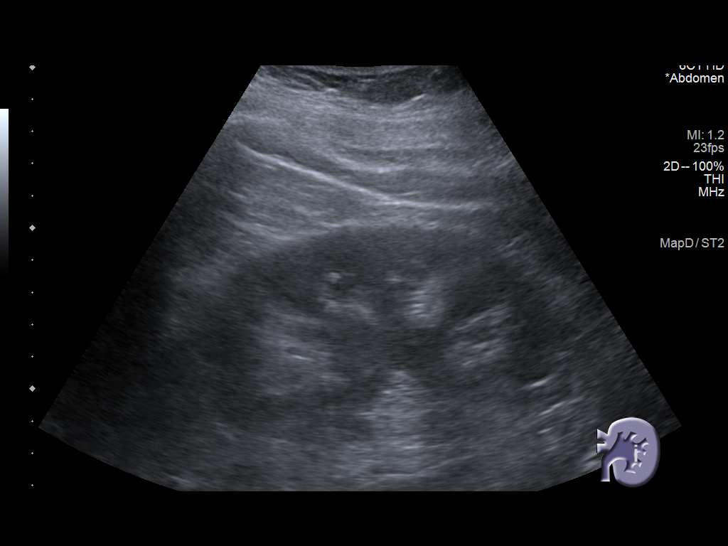
[im 47/57]
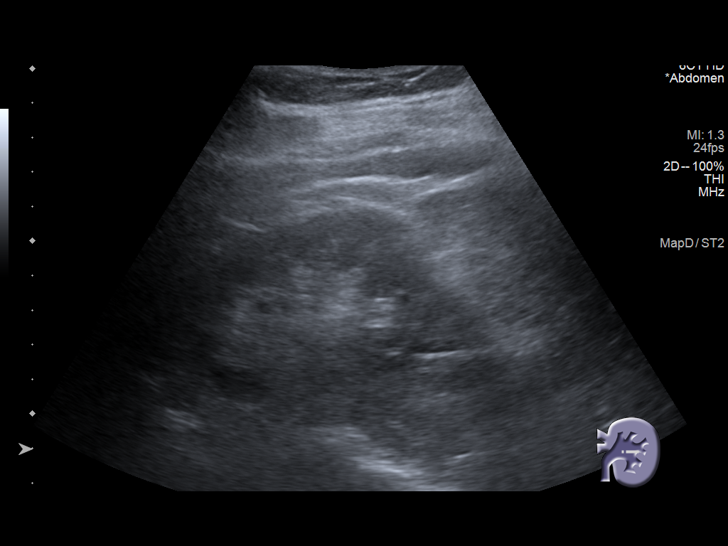
[im 52/57]
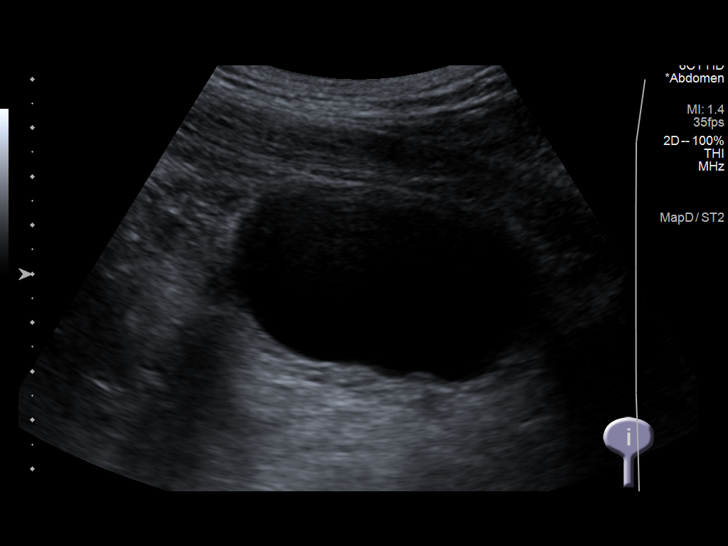
[im 57/57]
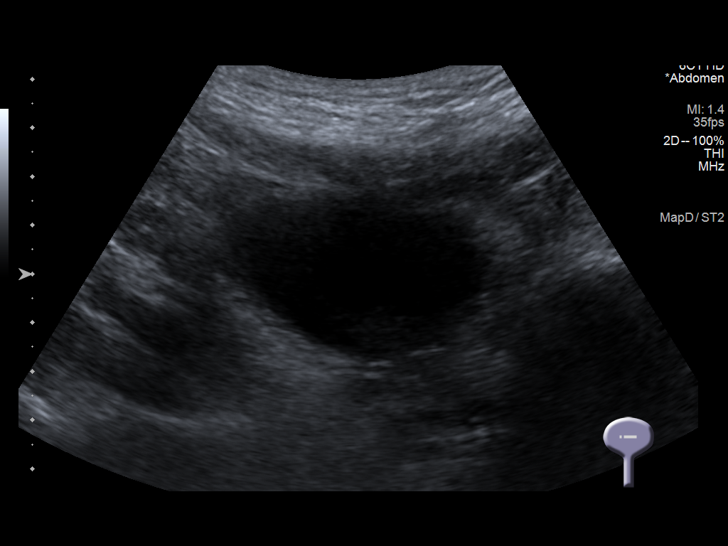

[14 of 25 positions shown; findings below may reference images not displayed]

FINDINGS: Right Kidney:

Renal measurements: 10.1 x 5.5 x 5.9 cm = volume: 170 mL. Renal
parenchyma is within normal limits. Multiple shadowing stones are
present. The largest at the upper pole measuring 10 mm. 7 mm stone
is present in the midportion of the right kidney. 9 mm stone is
present at the lower portion of the right kidney. Multiple other
smaller artifact scratched at multiple other smaller stones are
present. No obstruction is present.

Left Kidney:

Renal measurements: 12.4 x 5.9 x 7.1 cm = volume: 273 mL. Renal
parenchyma is of normal echotexture. Previously noted hydro
nephrosis has resolved.

Bladder:

Appears normal for degree of bladder distention.

Other:

None.
IMPRESSION: 1. Multiple residual nonobstructing stones at the right kidney
measuring up to 10 mm.
2. Resolution of previously noted left-sided hydronephrosis.

## 2022-07-17 ENCOUNTER — Ambulatory Visit: Payer: 59 | Admitting: Urology
